# Patient Record
Sex: Female | Born: 1985 | Race: White | Hispanic: No | Marital: Married | Smoking: Former smoker
Health system: Southern US, Community
[De-identification: ages and names within clinical notes are randomized; demographics above are authoritative.]

## PROBLEM LIST (undated history)

## (undated) DIAGNOSIS — I2699 Other pulmonary embolism without acute cor pulmonale: Secondary | ICD-10-CM

## (undated) DIAGNOSIS — R519 Headache, unspecified: Secondary | ICD-10-CM

## (undated) DIAGNOSIS — R51 Headache: Secondary | ICD-10-CM

## (undated) HISTORY — DX: Other pulmonary embolism without acute cor pulmonale: I26.99

## (undated) HISTORY — PX: OTHER SURGICAL HISTORY: SHX169

## (undated) HISTORY — DX: Headache: R51

## (undated) HISTORY — DX: Headache, unspecified: R51.9

---

## 2010-04-01 ENCOUNTER — Emergency Department (HOSPITAL_COMMUNITY): Admission: EM | Admit: 2010-04-01 | Discharge: 2010-04-01 | Payer: Self-pay | Admitting: Family Medicine

## 2010-04-01 ENCOUNTER — Emergency Department (HOSPITAL_COMMUNITY): Admission: EM | Admit: 2010-04-01 | Discharge: 2010-04-01 | Payer: Self-pay | Admitting: Emergency Medicine

## 2011-02-09 LAB — COMPREHENSIVE METABOLIC PANEL
ALT: 29 U/L (ref 0–35)
Albumin: 3.9 g/dL (ref 3.5–5.2)
Alkaline Phosphatase: 116 U/L (ref 39–117)
CO2: 23 mEq/L (ref 19–32)
GFR calc non Af Amer: >60 mL/min (ref >60)
Total Bilirubin: 0.5 mg/dL (ref 0.3–1.2)
Total Protein: 7.3 g/dL (ref 6.0–8.3)

## 2011-02-09 LAB — D-DIMER, QUANTITATIVE: D-Dimer, Quant: 0.31 ug/mL-FEU (ref 0.00–0.48)

## 2011-02-09 LAB — CBC
HCT: 42.4 % (ref 36.0–46.0)
Hemoglobin: 14.8 g/dL (ref 12.0–15.0)
MCV: 91.4 fL (ref 78.0–100.0)
Platelets: 260 10*3/uL (ref 150–400)
RBC: 4.63 MIL/uL (ref 3.87–5.11)
RDW: 14.1 % (ref 11.5–15.5)

## 2011-02-09 LAB — LIPASE, BLOOD: Lipase: 37 U/L (ref 11–59)

## 2011-02-09 LAB — POCT URINALYSIS DIP (DEVICE)
Bilirubin Urine: NEGATIVE
Specific Gravity, Urine: <=1.005 (ref 1.005–1.030)
Urobilinogen, UA: 0.2 mg/dL (ref 0.0–1.0)

## 2011-02-09 LAB — POCT PREGNANCY, URINE: Preg Test, Ur: NEGATIVE

## 2015-06-27 ENCOUNTER — Emergency Department (HOSPITAL_COMMUNITY)
Admission: EM | Admit: 2015-06-27 | Discharge: 2015-06-27 | Disposition: A | Discharge status: Home / Self Care | Payer: Self-pay | Attending: Emergency Medicine | Admitting: Emergency Medicine

## 2015-06-27 ENCOUNTER — Encounter (HOSPITAL_COMMUNITY): Payer: Self-pay | Admitting: Emergency Medicine

## 2015-06-27 DIAGNOSIS — H538 Other visual disturbances: Secondary | ICD-10-CM | POA: Insufficient documentation

## 2015-06-27 DIAGNOSIS — R202 Paresthesia of skin: Secondary | ICD-10-CM | POA: Insufficient documentation

## 2015-06-27 DIAGNOSIS — H53149 Visual discomfort, unspecified: Secondary | ICD-10-CM | POA: Insufficient documentation

## 2015-06-27 DIAGNOSIS — Z79899 Other long term (current) drug therapy: Secondary | ICD-10-CM | POA: Insufficient documentation

## 2015-06-27 DIAGNOSIS — H9319 Tinnitus, unspecified ear: Secondary | ICD-10-CM | POA: Insufficient documentation

## 2015-06-27 DIAGNOSIS — R519 Headache, unspecified: Secondary | ICD-10-CM

## 2015-06-27 DIAGNOSIS — R51 Headache: Secondary | ICD-10-CM | POA: Insufficient documentation

## 2015-06-27 DIAGNOSIS — R112 Nausea with vomiting, unspecified: Secondary | ICD-10-CM | POA: Insufficient documentation

## 2015-06-27 DIAGNOSIS — F419 Anxiety disorder, unspecified: Secondary | ICD-10-CM | POA: Insufficient documentation

## 2015-06-27 DIAGNOSIS — Z3202 Encounter for pregnancy test, result negative: Secondary | ICD-10-CM | POA: Insufficient documentation

## 2015-06-27 DIAGNOSIS — R200 Anesthesia of skin: Secondary | ICD-10-CM | POA: Insufficient documentation

## 2015-06-27 DIAGNOSIS — M542 Cervicalgia: Secondary | ICD-10-CM | POA: Insufficient documentation

## 2015-06-27 LAB — CBC WITH DIFFERENTIAL/PLATELET
Basophils Absolute: 0.1 K/uL (ref 0.0–0.1)
Basophils Relative: 1 % (ref 0–1)
Eosinophils Absolute: 0.2 K/uL (ref 0.0–0.7)
Eosinophils Relative: 3 % (ref 0–5)
HCT: 40.7 % (ref 36.0–46.0)
Hemoglobin: 13.7 g/dL (ref 12.0–15.0)
Lymphocytes Relative: 28 % (ref 12–46)
Lymphs Abs: 2 K/uL (ref 0.7–4.0)
MCH: 31.4 pg (ref 26.0–34.0)
MCHC: 33.7 g/dL (ref 30.0–36.0)
MCV: 93.3 fL (ref 78.0–100.0)
Monocytes Absolute: 0.4 K/uL (ref 0.1–1.0)
Monocytes Relative: 5 % (ref 3–12)
Neutro Abs: 4.3 K/uL (ref 1.7–7.7)
Neutrophils Relative %: 63 % (ref 43–77)
Platelets: 301 K/uL (ref 150–400)
RBC: 4.36 MIL/uL (ref 3.87–5.11)
RDW: 13.2 % (ref 11.5–15.5)
WBC: 6.9 K/uL (ref 4.0–10.5)

## 2015-06-27 LAB — I-STAT BETA HCG BLOOD, ED (MC, WL, AP ONLY): I-stat hCG, quantitative: <5 m[IU]/mL

## 2015-06-27 LAB — COMPREHENSIVE METABOLIC PANEL
ALT: 30 U/L (ref 14–54)
ANION GAP: 8 (ref 5–15)
AST: 24 U/L (ref 15–41)
Albumin: 4 g/dL (ref 3.5–5.0)
Alkaline Phosphatase: 86 U/L (ref 38–126)
BILIRUBIN TOTAL: 0.5 mg/dL (ref 0.3–1.2)
BUN: 10 mg/dL (ref 6–20)
CALCIUM: 9.3 mg/dL (ref 8.9–10.3)
CHLORIDE: 108 mmol/L (ref 101–111)
CO2: 24 mmol/L (ref 22–32)
CREATININE: 0.72 mg/dL (ref 0.44–1.00)
GFR calc Af Amer: >60 mL/min (ref >60)
GFR calc non Af Amer: >60 mL/min (ref >60)
GLUCOSE: 105 mg/dL — AB (ref 65–99)
POTASSIUM: 3.4 mmol/L — AB (ref 3.5–5.1)
SODIUM: 140 mmol/L (ref 135–145)
TOTAL PROTEIN: 7.5 g/dL (ref 6.5–8.1)

## 2015-06-27 MED ORDER — DIPHENHYDRAMINE HCL 50 MG/ML IJ SOLN
25.0000 mg | Freq: Once | INTRAMUSCULAR | Status: AC
  Administered 2015-06-27: 25 mg via INTRAVENOUS
  Filled 2015-06-27: qty 1

## 2015-06-27 MED ORDER — TRAMADOL HCL 50 MG PO TABS
50.0000 mg | ORAL_TABLET | Freq: Once | ORAL | Status: AC
  Administered 2015-06-27: 50 mg via ORAL
  Filled 2015-06-27: qty 1

## 2015-06-27 MED ORDER — SODIUM CHLORIDE 0.9 % IV BOLUS (SEPSIS)
1000.0000 mL | Freq: Once | INTRAVENOUS | Status: AC
  Administered 2015-06-27: 1000 mL via INTRAVENOUS

## 2015-06-27 MED ORDER — METOCLOPRAMIDE HCL 5 MG/ML IJ SOLN
10.0000 mg | Freq: Once | INTRAMUSCULAR | Status: AC
  Administered 2015-06-27: 10 mg via INTRAVENOUS
  Filled 2015-06-27: qty 2

## 2015-06-27 NOTE — ED Provider Notes (Signed)
CSN: [id number]     Arrival date & time 06/27/15  1526 History   First MD Initiated Contact with Patient 06/27/15 1655     Chief Complaint  Patient presents with  . Headache    HPI  Danielle Townsend is a 29 year old female presenting with a 1 week history of headache. She describes a 4 month history of similar headache episodes with multiple ED visits to [facility]. The episodes start as numbness and tingling on the right side of her face that then develop into a generalized headache. She describes the pain as sharp and like a tight pressure. She endorses photophobia, phonophobia, seeing black spots in her visual field, feeling confused, tension in the neck, nausea and vomiting accompanying her headaches. She has tried ibuprofen and tramadol for the headaches with no relief. She denies any headache triggers. Denies fever, vision loss, slurred speech, facial droop, limb weakness, imbalance, or recent head trauma.   History reviewed. No pertinent past medical history. History reviewed. No pertinent past surgical history. History reviewed. No pertinent family history. History  Substance Use Topics  . Smoking status: Never Smoker   . Smokeless tobacco: Not on file  . Alcohol Use: No   OB History    No data available     Review of Systems  Constitutional: Negative for fever.  HENT: Positive for tinnitus. Negative for ear pain and hearing loss.   Eyes: Positive for photophobia and visual disturbance.  Respiratory: Negative for shortness of breath.   Cardiovascular: Negative for chest pain and palpitations.  Gastrointestinal: Positive for nausea and vomiting. Negative for abdominal pain, diarrhea and constipation.  Musculoskeletal: Positive for neck stiffness. Negative for myalgias and arthralgias.  Skin: Negative for rash.  Neurological: Positive for numbness and headaches. Negative for dizziness, syncope, speech difficulty and weakness.  Psychiatric/Behavioral: The patient is  nervous/anxious.       Allergies  Review of patient's allergies indicates no known allergies.  Home Medications   Prior to Admission medications   Medication Sig Start Date End Date Taking? Authorizing Provider  ALPRAZolam Prudy Feeler) 0.25 MG tablet Take 0.25 mg by mouth daily as needed for anxiety.  06/10/15  Yes Historical Provider, MD   BP 94/65 mmHg  Pulse 67  Temp(Src) 98.3 F (36.8 C) (Oral)  Resp 17  SpO2 100% Physical Exam  Constitutional: She is oriented to person, place, and time. She appears well-developed and well-nourished. She appears distressed (tearful).  HENT:  Head: Normocephalic and atraumatic.  Eyes: EOM are normal. Pupils are equal, round, and reactive to light.  Neck: Normal range of motion. Neck supple.  Cardiovascular: Normal rate, regular rhythm and normal heart sounds.   Pulmonary/Chest: Effort normal and breath sounds normal.  Abdominal: Soft. There is no tenderness.  Neurological: She is alert and oriented to person, place, and time. She has normal strength. No cranial nerve deficit. Coordination normal.  Skin: Skin is warm and dry. No rash noted.  Psychiatric:  Tearful during interview, admits to feeling anxious about not being able to control her headaches    ED Course  Procedures (including critical care time) Labs Review Labs Reviewed  COMPREHENSIVE METABOLIC PANEL - Abnormal; Notable for the following:    Potassium 3.4 (*)    Glucose, Bld 105 (*)    All other components within normal limits  CBC WITH DIFFERENTIAL/PLATELET  I-STAT BETA HCG BLOOD, ED ([facility], [facility], [facility] ONLY)    Imaging Review No results found.   EKG Interpretation None  MDM   Final diagnoses:  Intractable episodic headache, unspecified headache type    1. Intractable Headache - CBC, CMP, HCG with no significant findings - Multiple negative head CTs from [facility] as reported by patient - Reglan 10 mg, Benadryl 25 mg, Ultram 50 mgt, NS bolus 1 L given in  ED with significant relief - Given numbers for [facility] neurology and told to make an appointment next week   Danielle Heimlich, PA-C 06/27/15 2215  Glynn Octave, MD 06/28/15 [phone]

## 2015-06-27 NOTE — ED Notes (Signed)
Discussed plan of care with Josh,  PA-C. Bolus has completed, bp 94/60s. He acknowledges, patient is to ambulate.

## 2015-06-27 NOTE — Discharge Instructions (Signed)
-  Continue at home regimen for headache treatment - Call neurology to set up an appointment - Return to emergency department if you develop fever, vision loss, weakness, neck stiffness, or worsening symptoms

## 2015-06-27 NOTE — ED Notes (Signed)
Patient ambulates in hallway approximately 191ft with RN supervision. Denies feeling dizzy while walking. Returned to room and discussed plan of care.

## 2015-06-27 NOTE — ED Notes (Signed)
Pt c/o generalized HA with N/V x 5 days with photophobia

## 2015-07-04 ENCOUNTER — Ambulatory Visit (INDEPENDENT_AMBULATORY_CARE_PROVIDER_SITE_OTHER): Payer: Self-pay | Admitting: Neurology

## 2015-07-04 ENCOUNTER — Encounter: Payer: Self-pay | Admitting: Neurology

## 2015-07-04 VITALS — BP 146/88 | HR 88 | Resp 16 | Ht 63.5 in | Wt 179.2 lb

## 2015-07-04 DIAGNOSIS — G43019 Migraine without aura, intractable, without status migrainosus: Secondary | ICD-10-CM | POA: Insufficient documentation

## 2015-07-04 DIAGNOSIS — R51 Headache: Secondary | ICD-10-CM

## 2015-07-04 DIAGNOSIS — F411 Generalized anxiety disorder: Secondary | ICD-10-CM | POA: Insufficient documentation

## 2015-07-04 DIAGNOSIS — G8929 Other chronic pain: Secondary | ICD-10-CM

## 2015-07-04 DIAGNOSIS — R208 Other disturbances of skin sensation: Secondary | ICD-10-CM

## 2015-07-04 DIAGNOSIS — R200 Anesthesia of skin: Secondary | ICD-10-CM

## 2015-07-04 DIAGNOSIS — R519 Headache, unspecified: Secondary | ICD-10-CM

## 2015-07-04 MED ORDER — AMITRIPTYLINE HCL 10 MG PO TABS
10.0000 mg | ORAL_TABLET | Freq: Every day | ORAL | Status: DC
Start: 2015-07-04 — End: 2016-10-15

## 2015-07-04 NOTE — Patient Instructions (Signed)
1.  I recommend that you sign up for the [facility] because we need to order tests.  MRI of brain with and without contrast, MRA of head without contrast and MRA of neck with contrast 2.  You will need to establish care with a primary care provider 3.  Start amitriptyline  at bedtime.  Give it 4 weeks and then call us with update.  We can increase dose if needed.  4.  Stop tramadol 5.  Follow up in 3 months.

## 2015-07-04 NOTE — Progress Notes (Addendum)
NEUROLOGY CONSULTATION NOTE  Danielle Townsend MRN: [id number] DOB: Dec 03, 1985   Referring provider: Glynn Octave, MD (from ED) Primary care provider: none  Reason for consult:  headache  HISTORY OF PRESENT ILLNESS: Danielle Townsend is a 29 year old right-handed female with generalized anxiety disorder, migraines and history of pulmonary embolism while pregnant who presents for right sided cephalgia.  ED and hospital notes from [facility], ED note from [facility], labs and CT head report reviewed.  In April, she was hospitalized at [facility] for palpitations.  As per hospital notes, CXR, EKG and labs, including CBC, CMP and troponins were normal.  She was diagnosed with a panic attack.  Since then, she has had a right sided cephalgia.  She describes a constant numbness and tingling involving the right temporal and parietal region, radiating down to the right side of her jaw.  She also notes a throbbing on the right side of her head as well as tightness and pain in the right side of her jaw.  It hurts when she eats.  She also has some pain on the right side of her neck.  She also notes generalized weakness but no focal weakness or numbness of the extremities.  She denies any strenuous activity or neck injury prior to onset of symptoms.  In May, she went to the ED at [facility] for these symptoms.  CT of the head was performed and was unremarkable.  Urine drug screen positive for THC.  She went to see a dentist who performed X-rays and was told nothing was wrong.  Last week, she went to the [facility] ED for a migraine, described as a severe holocephalic and bilateral retro-orbital 10/10 pounding pain.  It was associated with nausea, vomiting, photophobia, phonophobia, blurred vision and dizziness.  It lasted about 6 hours and was treated in the ED.  She has a history of migraines from childhood up to after having her first child in 2008.  This was her first migraine since then.   She also has history of generalized anxiety disorder.  She has had significant increase in anxiety since having her second child in 2014.  During that pregnancy, she developed a PE and was on anticoagulation for a year.  She has been on several antidepressants, most recently Lexapro and Zoloft, which she had side effects.  She also takes Xanax.  She has family history of MS.  PAST MEDICAL HISTORY: Past Medical History  Diagnosis Date  . Headache   . Pulmonary emboli     PAST SURGICAL HISTORY: Past Surgical History  Procedure Laterality Date  . C sections      MEDICATIONS: Current Outpatient Prescriptions on File Prior to Visit  Medication Sig Dispense Refill  . ALPRAZolam (XANAX) 0.25 MG tablet Take 0.25 mg by mouth daily as needed for anxiety.   1   No current facility-administered medications on file prior to visit.    ALLERGIES: No Known Allergies  FAMILY HISTORY: Family History  Problem Relation Age of Onset  . Diabetes Brother   . Celiac disease Brother   . Multiple sclerosis Maternal Grandmother   . Cancer Maternal Grandfather     prostate CLL   . Diabetes Maternal Grandfather   . Cancer Paternal Grandmother     breast   . Diabetes Paternal Grandfather     SOCIAL HISTORY: Social History   Social History  . Marital Status: Married    Spouse Name: N/A  . Number of  Children: N/A  . Years of Education: N/A   Occupational History  . Not on file.   Social History Main Topics  . Smoking status: Former Games developer  . Smokeless tobacco: Never Used  . Alcohol Use: 0.0 oz/week    0 Standard drinks or equivalent per week     Comment: socially   . Drug Use: No  . Sexual Activity:    Partners: Male   Other Topics Concern  . Not on file   Social History Narrative    REVIEW OF SYSTEMS: Constitutional: No fevers, chills, or sweats, no generalized fatigue, change in appetite Eyes: No visual changes, double vision, eye pain Ear, nose and throat: No hearing loss,  ear pain, nasal congestion, sore throat Cardiovascular: No chest pain, palpitations Respiratory:  No shortness of breath at rest or with exertion, wheezes GastrointestinaI: No nausea, vomiting, diarrhea, abdominal pain, fecal incontinence Genitourinary:  No dysuria, urinary retention or frequency Musculoskeletal:  Head and neck pain Integumentary: No rash, pruritus, skin lesions Neurological: as above Psychiatric: depression, anxiety Endocrine: No palpitations, fatigue, diaphoresis, mood swings, change in appetite, change in weight, increased thirst Hematologic/Lymphatic:  No anemia, purpura, petechiae. Allergic/Immunologic: no itchy/runny eyes, nasal congestion, recent allergic reactions, rashes  PHYSICAL EXAM: Filed Vitals:   07/04/15 0845  BP: 146/88  Pulse: 88  Resp: 16   General: No acute distress.  Easily tearful.  Patient appears well-groomed.   Head:  Normocephalic/atraumatic.  Tenderness to palpation of right TMJ, without crepitus. Eyes:  fundi unremarkable, without vessel changes, exudates, hemorrhages or papilledema. Neck: supple, bilateral tenderness, full range of motion, right suboccipital tenderness. Back: No paraspinal tenderness Heart: regular rate and rhythm Lungs: Clear to auscultation bilaterally. Vascular: No carotid bruits. Neurological Exam: Mental status: alert and oriented to person, place, and time, recent and remote memory intact, fund of knowledge intact, attention and concentration intact, speech fluent and not dysarthric, language intact. Cranial nerves: CN I: not tested CN II: pupils equal, round and reactive to light, visual fields intact, fundi unremarkable, without vessel changes, exudates, hemorrhages or papilledema. CN III, IV, VI:  full range of motion, no nystagmus, no ptosis CN V: facial sensation intact CN VII: upper and lower face symmetric CN VIII: hearing intact CN IX, X: gag intact, uvula midline CN XI: sternocleidomastoid and  trapezius muscles intact CN XII: tongue midline Bulk & Tone: normal, no fasciculations. Motor:  5/5 throughout Sensation:  Temperature and vibration intact Deep Tendon Reflexes:  3+ and symmetric in patellars, otherwise 2+.  Toes downgoing. Finger to nose testing:  No dysmetria Heel to shin:  No dysmetria Gait:  Normal station and stride.  Able to turn and walk in tandem. Romberg negative.  IMPRESSION: Fairly new-onset right cephalic and facial numbness and tingling, associated with right sided headache, neck pain and jaw pain.  Unclear etiology.  Isolated migraine without aura.  Isolated episode. Generalized anxiety disorder  PLAN: 1.  I would like to get MRI of brain with and without contrast and MRA of head and neck to rule out intracranial structural abnormality, intracranial aneurysm or arterial dissection.  Unfortunately, she does not have insurance at this time.  We instructed her on how to apply for the [facility] which she will do immediately. 2.  I also instructed her to establish care with a primary care provider 3.  In the meantime, I will prescribe her amitriptyline 10mg  at bedtime to help with the head paresthesias/discomfort.  Side effects discussed. She will call in 4 weeks  with update and we can adjust dose if needed.  Advised to discontinue tramadol, which she has not been taking anyway. 4.  Follow up in 3 months.  Thank you for allowing me to take part in the care of this patient.  Shon Millet, DO

## 2015-09-13 ENCOUNTER — Emergency Department (HOSPITAL_COMMUNITY)
Admission: EM | Admit: 2015-09-13 | Discharge: 2015-09-13 | Disposition: A | Discharge status: Home / Self Care | Payer: Self-pay | Attending: Emergency Medicine | Admitting: Emergency Medicine

## 2015-09-13 ENCOUNTER — Encounter (HOSPITAL_COMMUNITY): Payer: Self-pay

## 2015-09-13 DIAGNOSIS — S39012A Strain of muscle, fascia and tendon of lower back, initial encounter: Secondary | ICD-10-CM | POA: Insufficient documentation

## 2015-09-13 DIAGNOSIS — X58XXXA Exposure to other specified factors, initial encounter: Secondary | ICD-10-CM | POA: Insufficient documentation

## 2015-09-13 DIAGNOSIS — Z3202 Encounter for pregnancy test, result negative: Secondary | ICD-10-CM | POA: Insufficient documentation

## 2015-09-13 DIAGNOSIS — Y9289 Other specified places as the place of occurrence of the external cause: Secondary | ICD-10-CM | POA: Insufficient documentation

## 2015-09-13 DIAGNOSIS — Z87891 Personal history of nicotine dependence: Secondary | ICD-10-CM | POA: Insufficient documentation

## 2015-09-13 DIAGNOSIS — Y9389 Activity, other specified: Secondary | ICD-10-CM | POA: Insufficient documentation

## 2015-09-13 DIAGNOSIS — Z86711 Personal history of pulmonary embolism: Secondary | ICD-10-CM | POA: Insufficient documentation

## 2015-09-13 DIAGNOSIS — Y998 Other external cause status: Secondary | ICD-10-CM | POA: Insufficient documentation

## 2015-09-13 LAB — CBC WITH DIFFERENTIAL/PLATELET
Basophils Absolute: 0.1 10*3/uL (ref 0.0–0.1)
Basophils Relative: 1 %
Eosinophils Absolute: 0.2 10*3/uL (ref 0.0–0.7)
Eosinophils Relative: 4 %
HCT: 40 % (ref 36.0–46.0)
Hemoglobin: 13.2 g/dL (ref 12.0–15.0)
Lymphocytes Relative: 37 %
Lymphs Abs: 1.8 10*3/uL (ref 0.7–4.0)
MCH: 30.8 pg (ref 26.0–34.0)
MCHC: 33 g/dL (ref 30.0–36.0)
MCV: 93.5 fL (ref 78.0–100.0)
Monocytes Absolute: 0.4 10*3/uL (ref 0.1–1.0)
Monocytes Relative: 8 %
Neutro Abs: 2.5 10*3/uL (ref 1.7–7.7)
Neutrophils Relative %: 50 %
Platelets: 304 10*3/uL (ref 150–400)
RBC: 4.28 MIL/uL (ref 3.87–5.11)
RDW: 13.2 % (ref 11.5–15.5)
WBC: 5 10*3/uL (ref 4.0–10.5)

## 2015-09-13 LAB — BASIC METABOLIC PANEL
Anion gap: 6 (ref 5–15)
BUN: 6 mg/dL (ref 6–20)
CHLORIDE: 112 mmol/L — AB (ref 101–111)
CO2: 24 mmol/L (ref 22–32)
CREATININE: 0.71 mg/dL (ref 0.44–1.00)
Calcium: 9 mg/dL (ref 8.9–10.3)
GLUCOSE: 87 mg/dL (ref 65–99)
Potassium: 3.6 mmol/L (ref 3.5–5.1)
Sodium: 142 mmol/L (ref 135–145)

## 2015-09-13 LAB — URINALYSIS, ROUTINE W REFLEX MICROSCOPIC
Bilirubin Urine: NEGATIVE
Glucose, UA: NEGATIVE mg/dL
Hgb urine dipstick: NEGATIVE
Ketones, ur: NEGATIVE mg/dL
Leukocytes, UA: NEGATIVE
Nitrite: NEGATIVE
Protein, ur: NEGATIVE mg/dL
Specific Gravity, Urine: 1.004 — ABNORMAL LOW (ref 1.005–1.030)
Urobilinogen, UA: 0.2 mg/dL (ref 0.0–1.0)
pH: 6 (ref 5.0–8.0)

## 2015-09-13 LAB — POC URINE PREG, ED: Preg Test, Ur: NEGATIVE

## 2015-09-13 MED ORDER — HYDROCODONE-ACETAMINOPHEN 5-325 MG PO TABS: 1.0000 | ORAL_TABLET | Freq: Four times a day (QID) | ORAL | Status: DC | PRN

## 2015-09-13 MED ORDER — HYDROCODONE-ACETAMINOPHEN 5-325 MG PO TABS
2.0000 | ORAL_TABLET | Freq: Once | ORAL | Status: AC
  Administered 2015-09-13: 2 via ORAL
  Filled 2015-09-13: qty 2

## 2015-09-13 MED ORDER — KETOROLAC TROMETHAMINE 60 MG/2ML IM SOLN
60.0000 mg | Freq: Once | INTRAMUSCULAR | Status: AC
  Administered 2015-09-13: 60 mg via INTRAMUSCULAR
  Filled 2015-09-13: qty 2

## 2015-09-13 MED ORDER — CYCLOBENZAPRINE HCL 10 MG PO TABS: 10.0000 mg | ORAL_TABLET | Freq: Two times a day (BID) | ORAL | Status: DC | PRN

## 2015-09-13 MED ORDER — CYCLOBENZAPRINE HCL 10 MG PO TABS
10.0000 mg | ORAL_TABLET | Freq: Once | ORAL | Status: AC
  Administered 2015-09-13: 10 mg via ORAL
  Filled 2015-09-13: qty 1

## 2015-09-13 MED ORDER — LIDOCAINE 5 % EX PTCH: 1.0000 | MEDICATED_PATCH | CUTANEOUS | Status: DC

## 2015-09-13 NOTE — Discharge Instructions (Signed)
Back Injury Prevention Back injuries can be very painful. They can also be difficult to heal. After having one back injury, you are more likely to injure your back again. It is important to learn how to avoid injuring or re-injuring your back. The following tips can help you to prevent a back injury. WHAT SHOULD I KNOW ABOUT PHYSICAL FITNESS?  Exercise for 30 minutes per day on most days of the week or as told by your doctor. Make sure to:  Do aerobic exercises, such as walking, jogging, biking, or swimming.  Do exercises that increase balance and strength, such as tai chi and yoga.  Do stretching exercises. This helps with flexibility.  Try to develop strong belly (abdominal) muscles. Your belly muscles help to support your back.  Stay at a healthy weight. This helps to decrease your risk of a back injury. WHAT SHOULD I KNOW ABOUT MY DIET?  Talk with your doctor about your overall diet. Take supplements and vitamins only as told by your doctor.  Talk with your doctor about how much calcium and vitamin D you need each day. These nutrients help to prevent weakening of the bones (osteoporosis).  Include good sources of calcium in your diet, such as:  Dairy products.  Green leafy vegetables.  Products that have had calcium added to them (fortified).  Include good sources of vitamin D in your diet, such as:  Milk.  Foods that have had vitamin D added to them. WHAT SHOULD I KNOW ABOUT MY POSTURE?  Sit up straight and stand up straight. Avoid leaning forward when you sit or hunching over when you stand.  Choose chairs that have good low-back (lumbar) support.  If you work at a desk, sit close to it so you do not need to lean over. Keep your chin tucked in. Keep your neck drawn back. Keep your elbows bent so your arms look like the letter "L" (right angle).  Sit high and close to the steering wheel when you drive. Add a low-back support to your car seat, if needed.  Avoid sitting  or standing in one position for very long. Take breaks to get up, stretch, and walk around at least one time every hour. Take breaks every hour if you are driving for long periods of time.  Sleep on your side with your knees slightly bent, or sleep on your back with a pillow under your knees. Do not lie on the front of your body to sleep. WHAT SHOULD I KNOW ABOUT LIFTING, TWISTING, AND REACHING Lifting and Heavy Lifting  Avoid heavy lifting, especially lifting over and over again. If you must do heavy lifting:  Stretch before lifting.  Work slowly.  Rest between lifts.  Use a tool such as a cart or a dolly to move objects if one is available.  Make several small trips instead of carrying one heavy load.  Ask for help when you need it, especially when moving big objects.  Follow these steps when lifting:  Stand with your feet shoulder-width apart.  Get as close to the object as you can. Do not pick up a heavy object that is far from your body.  Use handles or lifting straps if they are available.  Bend at your knees. Squat down, but keep your heels off the floor.  Keep your shoulders back. Keep your chin tucked in. Keep your back straight.  Lift the object slowly while you tighten the muscles in your legs, belly, and butt. Keep the object  as close to the center of your body as possible.  Follow these steps when putting down a heavy load:  Stand with your feet shoulder-width apart.  Lower the object slowly while you tighten the muscles in your legs, belly, and butt. Keep the object as close to the center of your body as possible.  Keep your shoulders back. Keep your chin tucked in. Keep your back straight.  Bend at your knees. Squat down, but keep your heels off the floor.  Use handles or lifting straps if they are available. Twisting and Reaching  Avoid lifting heavy objects above your waist.  Do not twist at your waist while you are lifting or carrying a load. If  you need to turn, move your feet.  Do not bend over without bending at your knees.  Avoid reaching over your head, across a table, or for an object on a high surface.  WHAT ARE SOME OTHER TIPS?  Avoid wet floors and icy ground. Keep sidewalks clear of ice to prevent falls.   Do not sleep on a mattress that is too soft or too hard.   Keep items that you use often within easy reach.   Put heavier objects on shelves at waist level, and put lighter objects on lower or higher shelves.  Find ways to lower your stress, such as:  Exercise.  Massage.  Relaxation techniques.  Talk with your doctor if you feel anxious or depressed. These conditions can make back pain worse.  Wear flat heel shoes with cushioned soles.  Avoid making quick (sudden) movements.  Use both shoulder straps when carrying a backpack.  Do not use any tobacco products, including cigarettes, chewing tobacco, or electronic cigarettes. If you need help quitting, ask your doctor.   This information is not intended to replace advice given to you by your health care provider. Make sure you discuss any questions you have with your health care provider.   Document Released: 04/26/2008 Document Revised: 03/25/2015 Document Reviewed: 11/12/2014 [employer] Interactive Patient Education 2016 [place] Strain With Rehab A strain is an injury in which a tendon or muscle is torn. The muscles and tendons of the lower back are vulnerable to strains. However, these muscles and tendons are very strong and require a great force to be injured. Strains are classified into three categories. Grade 1 strains cause pain, but the tendon is not lengthened. Grade 2 strains include a lengthened ligament, due to the ligament being stretched or partially ruptured. With grade 2 strains there is still function, although the function may be decreased. Grade 3 strains involve a complete tear of the tendon or muscle, and function is  usually impaired. SYMPTOMS   Pain in the lower back.  Pain that affects one side more than the other.  Pain that gets worse with movement and may be felt in the hip, buttocks, or back of the thigh.  Muscle spasms of the muscles in the back.  Swelling along the muscles of the back.  Loss of strength of the back muscles.  Crackling sound (crepitation) when the muscles are touched. CAUSES  Lower back strains occur when a force is placed on the muscles or tendons that is greater than they can handle. Common causes of injury include:  Prolonged overuse of the muscle-tendon units in the lower back, usually from incorrect posture.  A single violent injury or force applied to the back. RISK INCREASES WITH:  Sports that involve twisting forces on the spine  or a lot of bending at the waist (football, rugby, weightlifting, bowling, golf, tennis, speed skating, racquetball, swimming, running, gymnastics, diving).  Poor strength and flexibility.  Failure to warm up properly before activity.  Family history of lower back pain or disk disorders.  Previous back injury or surgery (especially fusion).  Poor posture with lifting, especially heavy objects.  Prolonged sitting, especially with poor posture. PREVENTION   Learn and use proper posture when sitting or lifting (maintain proper posture when sitting, lift using the knees and legs, not at the waist).  Warm up and stretch properly before activity.  Allow for adequate recovery between workouts.  Maintain physical fitness:  Strength, flexibility, and endurance.  Cardiovascular fitness. PROGNOSIS  If treated properly, lower back strains usually heal within 6 weeks. RELATED COMPLICATIONS   Recurring symptoms, resulting in a chronic problem.  Chronic inflammation, scarring, and partial muscle-tendon tear.  Delayed healing or resolution of symptoms.  Prolonged disability. TREATMENT  Treatment first involves the use of ice  and medicine, to reduce pain and inflammation. The use of strengthening and stretching exercises may help reduce pain with activity. These exercises may be performed at home or with a therapist. Severe injuries may require referral to a therapist for further evaluation and treatment, such as ultrasound. Your caregiver may advise that you wear a back brace or corset, to help reduce pain and discomfort. Often, prolonged bed rest results in greater harm then benefit. Corticosteroid injections may be recommended. However, these should be reserved for the most serious cases. It is important to avoid using your back when lifting objects. At night, sleep on your back on a firm mattress with a pillow placed under your knees. If non-surgical treatment is unsuccessful, surgery may be needed.  MEDICATION   If pain medicine is needed, nonsteroidal anti-inflammatory medicines (aspirin and ibuprofen), or other minor pain relievers (acetaminophen), are often advised.  Do not take pain medicine for 7 days before surgery.  Prescription pain relievers may be given, if your caregiver thinks they are needed. Use only as directed and only as much as you need.  Ointments applied to the skin may be helpful.  Corticosteroid injections may be given by your caregiver. These injections should be reserved for the most serious cases, because they may only be given a certain number of times. HEAT AND COLD  Cold treatment (icing) should be applied for 10 to 15 minutes every 2 to 3 hours for inflammation and pain, and immediately after activity that aggravates your symptoms. Use ice packs or an ice massage.  Heat treatment may be used before performing stretching and strengthening activities prescribed by your caregiver, physical therapist, or athletic trainer. Use a heat pack or a warm water soak. SEEK MEDICAL CARE IF:   Symptoms get worse or do not improve in 2 to 4 weeks, despite treatment.  You develop numbness, weakness,  or loss of bowel or bladder function.  New, unexplained symptoms develop. (Drugs used in treatment may produce side effects.) EXERCISES  RANGE OF MOTION (ROM) AND STRETCHING EXERCISES - Low Back Strain Most people with lower back pain will find that their symptoms get worse with excessive bending forward (flexion) or arching at the lower back (extension). The exercises which will help resolve your symptoms will focus on the opposite motion.  Your physician, physical therapist or athletic trainer will help you determine which exercises will be most helpful to resolve your lower back pain. Do not complete any exercises without first consulting with your  caregiver. Discontinue any exercises which make your symptoms worse until you speak to your caregiver.  If you have pain, numbness or tingling which travels down into your buttocks, leg or foot, the goal of the therapy is for these symptoms to move closer to your back and eventually resolve. Sometimes, these leg symptoms will get better, but your lower back pain may worsen. This is typically an indication of progress in your rehabilitation. Be very alert to any changes in your symptoms and the activities in which you participated in the 24 hours prior to the change. Sharing this information with your caregiver will allow him/her to most efficiently treat your condition.  These exercises may help you when beginning to rehabilitate your injury. Your symptoms may resolve with or without further involvement from your physician, physical therapist or athletic trainer. While completing these exercises, remember:  Restoring tissue flexibility helps normal motion to return to the joints. This allows healthier, less painful movement and activity.  An effective stretch should be held for at least 30 seconds.  A stretch should never be painful. You should only feel a gentle lengthening or release in the stretched tissue. FLEXION RANGE OF MOTION AND STRETCHING  EXERCISES: STRETCH - Flexion, Single Knee to Chest   Lie on a firm bed or floor with both legs extended in front of you.  Keeping one leg in contact with the floor, bring your opposite knee to your chest. Hold your leg in place by either grabbing behind your thigh or at your knee.  Pull until you feel a gentle stretch in your lower back. Hold __________ seconds.  Slowly release your grasp and repeat the exercise with the opposite side. Repeat __________ times. Complete this exercise __________ times per day.  STRETCH - Flexion, Double Knee to Chest   Lie on a firm bed or floor with both legs extended in front of you.  Keeping one leg in contact with the floor, bring your opposite knee to your chest.  Tense your stomach muscles to support your back and then lift your other knee to your chest. Hold your legs in place by either grabbing behind your thighs or at your knees.  Pull both knees toward your chest until you feel a gentle stretch in your lower back. Hold __________ seconds.  Tense your stomach muscles and slowly return one leg at a time to the floor. Repeat __________ times. Complete this exercise __________ times per day.  STRETCH - Low Trunk Rotation  Lie on a firm bed or floor. Keeping your legs in front of you, bend your knees so they are both pointed toward the ceiling and your feet are flat on the floor.  Extend your arms out to the side. This will stabilize your upper body by keeping your shoulders in contact with the floor.  Gently and slowly drop both knees together to one side until you feel a gentle stretch in your lower back. Hold for __________ seconds.  Tense your stomach muscles to support your lower back as you bring your knees back to the starting position. Repeat the exercise to the other side. Repeat __________ times. Complete this exercise __________ times per day  EXTENSION RANGE OF MOTION AND FLEXIBILITY EXERCISES: STRETCH - Extension, Prone on Elbows    Lie on your stomach on the floor, a bed will be too soft. Place your palms about shoulder width apart and at the height of your head.  Place your elbows under your shoulders. If this is too  painful, stack pillows under your chest.  Allow your body to relax so that your hips drop lower and make contact more completely with the floor.  Hold this position for __________ seconds.  Slowly return to lying flat on the floor. Repeat __________ times. Complete this exercise __________ times per day.  RANGE OF MOTION - Extension, Prone Press Ups  Lie on your stomach on the floor, a bed will be too soft. Place your palms about shoulder width apart and at the height of your head.  Keeping your back as relaxed as possible, slowly straighten your elbows while keeping your hips on the floor. You may adjust the placement of your hands to maximize your comfort. As you gain motion, your hands will come more underneath your shoulders.  Hold this position __________ seconds.  Slowly return to lying flat on the floor. Repeat __________ times. Complete this exercise __________ times per day.  RANGE OF MOTION- Quadruped, Neutral Spine   Assume a hands and knees position on a firm surface. Keep your hands under your shoulders and your knees under your hips. You may place padding under your knees for comfort.  Drop your head and point your tail bone toward the ground below you. This will round out your lower back like an angry cat. Hold this position for __________ seconds.  Slowly lift your head and release your tail bone so that your back sags into a large arch, like an old horse.  Hold this position for __________ seconds.  Repeat this until you feel limber in your lower back.  Now, find your "sweet spot." This will be the most comfortable position somewhere between the two previous positions. This is your neutral spine. Once you have found this position, tense your stomach muscles to support your  lower back.  Hold this position for __________ seconds. Repeat __________ times. Complete this exercise __________ times per day.  STRENGTHENING EXERCISES - Low Back Strain These exercises may help you when beginning to rehabilitate your injury. These exercises should be done near your "sweet spot." This is the neutral, low-back arch, somewhere between fully rounded and fully arched, that is your least painful position. When performed in this safe range of motion, these exercises can be used for people who have either a flexion or extension based injury. These exercises may resolve your symptoms with or without further involvement from your physician, physical therapist or athletic trainer. While completing these exercises, remember:   Muscles can gain both the endurance and the strength needed for everyday activities through controlled exercises.  Complete these exercises as instructed by your physician, physical therapist or athletic trainer. Increase the resistance and repetitions only as guided.  You may experience muscle soreness or fatigue, but the pain or discomfort you are trying to eliminate should never worsen during these exercises. If this pain does worsen, stop and make certain you are following the directions exactly. If the pain is still present after adjustments, discontinue the exercise until you can discuss the trouble with your caregiver. STRENGTHENING - Deep Abdominals, Pelvic Tilt  Lie on a firm bed or floor. Keeping your legs in front of you, bend your knees so they are both pointed toward the ceiling and your feet are flat on the floor.  Tense your lower abdominal muscles to press your lower back into the floor. This motion will rotate your pelvis so that your tail bone is scooping upwards rather than pointing at your feet or into the floor.  With a  gentle tension and even breathing, hold this position for __________ seconds. Repeat __________ times. Complete this exercise  __________ times per day.  STRENGTHENING - Abdominals, Crunches   Lie on a firm bed or floor. Keeping your legs in front of you, bend your knees so they are both pointed toward the ceiling and your feet are flat on the floor. Cross your arms over your chest.  Slightly tip your chin down without bending your neck.  Tense your abdominals and slowly lift your trunk high enough to just clear your shoulder blades. Lifting higher can put excessive stress on the lower back and does not further strengthen your abdominal muscles.  Control your return to the starting position. Repeat __________ times. Complete this exercise __________ times per day.  STRENGTHENING - Quadruped, Opposite UE/LE Lift   Assume a hands and knees position on a firm surface. Keep your hands under your shoulders and your knees under your hips. You may place padding under your knees for comfort.  Find your neutral spine and gently tense your abdominal muscles so that you can maintain this position. Your shoulders and hips should form a rectangle that is parallel with the floor and is not twisted.  Keeping your trunk steady, lift your right hand no higher than your shoulder and then your left leg no higher than your hip. Make sure you are not holding your breath. Hold this position __________ seconds.  Continuing to keep your abdominal muscles tense and your back steady, slowly return to your starting position. Repeat with the opposite arm and leg. Repeat __________ times. Complete this exercise __________ times per day.  STRENGTHENING - Lower Abdominals, Double Knee Lift  Lie on a firm bed or floor. Keeping your legs in front of you, bend your knees so they are both pointed toward the ceiling and your feet are flat on the floor.  Tense your abdominal muscles to brace your lower back and slowly lift both of your knees until they come over your hips. Be certain not to hold your breath.  Hold __________ seconds. Using your  abdominal muscles, return to the starting position in a slow and controlled manner. Repeat __________ times. Complete this exercise __________ times per day.  POSTURE AND BODY MECHANICS CONSIDERATIONS - Low Back Strain Keeping correct posture when sitting, standing or completing your activities will reduce the stress put on different body tissues, allowing injured tissues a chance to heal and limiting painful experiences. The following are general guidelines for improved posture. Your physician or physical therapist will provide you with any instructions specific to your needs. While reading these guidelines, remember:  The exercises prescribed by your provider will help you have the flexibility and strength to maintain correct postures.  The correct posture provides the best environment for your joints to work. All of your joints have less wear and tear when properly supported by a spine with good posture. This means you will experience a healthier, less painful body.  Correct posture must be practiced with all of your activities, especially prolonged sitting and standing. Correct posture is as important when doing repetitive low-stress activities (typing) as it is when doing a single heavy-load activity (lifting). RESTING POSITIONS Consider which positions are most painful for you when choosing a resting position. If you have pain with flexion-based activities (sitting, bending, stooping, squatting), choose a position that allows you to rest in a less flexed posture. You would want to avoid curling into a fetal position on your side. If your  pain worsens with extension-based activities (prolonged standing, working overhead), avoid resting in an extended position such as sleeping on your stomach. Most people will find more comfort when they rest with their spine in a more neutral position, neither too rounded nor too arched. Lying on a non-sagging bed on your side with a pillow between your knees, or on  your back with a pillow under your knees will often provide some relief. Keep in mind, being in any one position for a prolonged period of time, no matter how correct your posture, can still lead to stiffness. PROPER SITTING POSTURE In order to minimize stress and discomfort on your spine, you must sit with correct posture. Sitting with good posture should be effortless for a healthy body. Returning to good posture is a gradual process. Many people can work toward this most comfortably by using various supports until they have the flexibility and strength to maintain this posture on their own. When sitting with proper posture, your ears will fall over your shoulders and your shoulders will fall over your hips. You should use the back of the chair to support your upper back. Your lower back will be in a neutral position, just slightly arched. You may place a small pillow or folded towel at the base of your lower back for support.  When working at a desk, create an environment that supports good, upright posture. Without extra support, muscles tire, which leads to excessive strain on joints and other tissues. Keep these recommendations in mind: CHAIR:  A chair should be able to slide under your desk when your back makes contact with the back of the chair. This allows you to work closely.  The chair's height should allow your eyes to be level with the upper part of your monitor and your hands to be slightly lower than your elbows. BODY POSITION  Your feet should make contact with the floor. If this is not possible, use a foot rest.  Keep your ears over your shoulders. This will reduce stress on your neck and lower back. INCORRECT SITTING POSTURES  If you are feeling tired and unable to assume a healthy sitting posture, do not slouch or slump. This puts excessive strain on your back tissues, causing more damage and pain. Healthier options include:  Using more support, like a lumbar pillow.  Switching  tasks to something that requires you to be upright or walking.  Talking a brief walk.  Lying down to rest in a neutral-spine position. PROLONGED STANDING WHILE SLIGHTLY LEANING FORWARD  When completing a task that requires you to lean forward while standing in one place for a long time, place either foot up on a stationary 2-4 inch high object to help maintain the best posture. When both feet are on the ground, the lower back tends to lose its slight inward curve. If this curve flattens (or becomes too large), then the back and your other joints will experience too much stress, tire more quickly, and can cause pain. CORRECT STANDING POSTURES Proper standing posture should be assumed with all daily activities, even if they only take a few moments, like when brushing your teeth. As in sitting, your ears should fall over your shoulders and your shoulders should fall over your hips. You should keep a slight tension in your abdominal muscles to brace your spine. Your tailbone should point down to the ground, not behind your body, resulting in an over-extended swayback posture.  INCORRECT STANDING POSTURES  Common incorrect standing postures  include a forward head, locked knees and/or an excessive swayback. WALKING Walk with an upright posture. Your ears, shoulders and hips should all line-up. PROLONGED ACTIVITY IN A FLEXED POSITION When completing a task that requires you to bend forward at your waist or lean over a low surface, try to find a way to stabilize 3 out of 4 of your limbs. You can place a hand or elbow on your thigh or rest a knee on the surface you are reaching across. This will provide you more stability so that your muscles do not fatigue as quickly. By keeping your knees relaxed, or slightly bent, you will also reduce stress across your lower back. CORRECT LIFTING TECHNIQUES DO :   Assume a wide stance. This will provide you more stability and the opportunity to get as close as possible  to the object which you are lifting.  Tense your abdominals to brace your spine. Bend at the knees and hips. Keeping your back locked in a neutral-spine position, lift using your leg muscles. Lift with your legs, keeping your back straight.  Test the weight of unknown objects before attempting to lift them.  Try to keep your elbows locked down at your sides in order get the best strength from your shoulders when carrying an object.  Always ask for help when lifting heavy or awkward objects. INCORRECT LIFTING TECHNIQUES DO NOT:   Lock your knees when lifting, even if it is a small object.  Bend and twist. Pivot at your feet or move your feet when needing to change directions.  Assume that you can safely pick up even a paper clip without proper posture.   This information is not intended to replace advice given to you by your health care provider. Make sure you discuss any questions you have with your health care provider.   Document Released: 11/08/2005 Document Revised: 11/29/2014 Document Reviewed: 02/20/2009 Elsevier Interactive Patient Education [employer].

## 2015-09-13 NOTE — ED Provider Notes (Addendum)
CSN: [id number]     Arrival date & time 09/13/15  1032 History   First MD Initiated Contact with Patient 09/13/15 1050     Chief Complaint  Patient presents with  . Back Pain     (Consider location/radiation/quality/duration/timing/severity/associated sxs/prior Treatment) Patient is a 29 y.o. female presenting with back pain. The history is provided by the patient.  Back Pain Location:  Lumbar spine Quality:  Aching, shooting and stabbing Radiates to: left buttocks. 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 Headache   . Pulmonary emboli [facility]HCC)    Past Surgical History  Procedure Laterality Date  . C sections     Family History  Problem Relation Age of Onset  . Diabetes Brother   . Celiac disease Brother   . Multiple sclerosis Maternal Grandmother   . Cancer Maternal Grandfather     prostate CLL   . Diabetes Maternal Grandfather   . Cancer Paternal Grandmother     breast   . Diabetes Paternal Grandfather    Social History  Substance Use Topics  . Smoking status: Former Games developermoker  . Smokeless tobacco: Never Used  . Alcohol Use: 0.0 oz/week    0 Standard drinks or equivalent per week     Comment: socially    OB History    No data  available     Review of Systems  Constitutional: Negative for fever.  Cardiovascular: Negative for chest pain.  Gastrointestinal: Negative for abdominal pain.  Genitourinary: Negative for dysuria.  Musculoskeletal: Positive for back pain.  Neurological: Negative for weakness, numbness and paresthesias.  All other systems reviewed and are negative.     Allergies  Review of patient's allergies indicates no known allergies.  Home Medications   Prior to Admission medications   Medication Sig Start Date End Date Taking? Authorizing Provider  ALPRAZolam Prudy Feeler(XANAX) 0.25 MG tablet Take 0.25 mg by mouth daily as needed for anxiety.  06/10/15  Yes Historical Provider, MD  ibuprofen (ADVIL,MOTRIN) 800 MG tablet Take 800 mg by mouth every 8 (eight) hours as needed (pain).  05/20/15  Yes Historical Provider, MD  Naproxen Sodium (ALEVE PO) Take 1 tablet by mouth 2 (two) times daily as needed (pain).   Yes Historical Provider, MD  amitriptyline (ELAVIL) 10 MG tablet Take 1 tablet (10 mg total) by mouth at bedtime. Patient not taking: Reported on 09/13/2015 07/04/15   Drema DallasAdam R Jaffe, DO   BP 120/73 mmHg  Pulse 69  Resp 20  Ht 5\' 4"  (1.626 m)  Wt 170 lb (77.111 kg)  BMI 29.17 kg/m2  SpO2 99%  LMP 09/08/2015 Physical Exam  Constitutional: She is oriented to person, place, and time. She appears well-developed and well-nourished. No distress.  HENT:  Head: Normocephalic and  atraumatic.  Mouth/Throat: Oropharynx is clear and moist.  Eyes: Conjunctivae and EOM are normal. Pupils are equal, round, and reactive to light.  Neck: Normal range of motion. Neck supple.  Cardiovascular: Normal rate, regular rhythm and intact distal pulses.   No murmur heard. Pulmonary/Chest: Effort normal and breath sounds normal. No respiratory distress. She has no wheezes. She has no rales.  Abdominal: Soft. She exhibits no distension. There is no tenderness. There is no rebound, no guarding and no CVA tenderness.    Musculoskeletal: Normal range of motion. She exhibits no edema.       Lumbar back: She exhibits tenderness, bony tenderness, pain and spasm.       Back:  Neurological: She is alert and oriented to person, place, and time. She has normal strength. No sensory deficit.  Skin: Skin is warm and dry. No rash noted. No erythema.  Psychiatric: She has a normal mood and affect. Her behavior is normal.  Nursing note and vitals reviewed.   ED Course  Procedures (including critical care time) Labs Review Labs Reviewed  BASIC METABOLIC PANEL - Abnormal; Notable for the following:    Chloride 112 (*)    All other components within normal limits  URINALYSIS, ROUTINE W REFLEX MICROSCOPIC (NOT AT [facility]) - Abnormal; Notable for the following:    Specific Gravity, Urine 1.004 (*)    All other components within normal limits  CBC WITH DIFFERENTIAL/PLATELET  POC URINE PREG, ED    Imaging Review No results found. I have personally reviewed and evaluated these images and lab results as part of my medical decision-making.   EKG Interpretation None      MDM   Final diagnoses:  Back strain, initial encounter    Patient is an otherwise healthy 29 year old who presents today with severe 10 out of 10 lower back pain that's been worsening over the last 4 days. She has mild radiation into the left buttocks. Pain radiates around to the abdomen but no true abdominal pain. No numbness or tingling in the legs. She denies any recent surgeries, injections or IV drug use. No history of cancer. No unilateral leg pain or swelling at this time. She states she will occasionally get swelling of her left leg and did have a PE when pregnant but no new symptoms in those areas. She denies any vaginal discharge, itching and finished her menses a few days ago.  She states she had pain like this one other time and was told she had a UTI. 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  On exam patient is tearful and has significant tenderness in the lumbar spine as well as the paraspinal muscles. She is able to ambulate but it's painful as well as normal strength and sensation of the legs.  Possibility for a urinary process versus musculoskeletal. She has no risk factors for epidural abscess or hematoma. She denies any trauma. Low suspicion for vaginal pathology. Low suspicion for ovarian torsion. Low suspicion for kidney stone.  1:33 PM Labs are without acute findings. Some improvement with pain control. Feel most likely symptoms are musculoskeletal in nature. Discussed with patient NSAIDs, Flexeril and pain control.  Gwyneth Sprout, MD 09/13/15 1334  Gwyneth Sprout, MD 09/13/15 1342

## 2015-09-13 NOTE — ED Notes (Signed)
Lower back pain began 4 days ago. Denies any injuries.  She had this type of pain before and she was having a UTI,   Pt. Denies any dysuria or urgency, Pt. Denies any vaginal problems.  Pt. Very uncomfortable

## 2015-10-06 ENCOUNTER — Ambulatory Visit: Payer: Self-pay | Admitting: Neurology

## 2015-12-02 ENCOUNTER — Emergency Department (HOSPITAL_COMMUNITY): Payer: Self-pay

## 2015-12-02 ENCOUNTER — Encounter (HOSPITAL_COMMUNITY): Payer: Self-pay | Admitting: Emergency Medicine

## 2015-12-02 DIAGNOSIS — Z3202 Encounter for pregnancy test, result negative: Secondary | ICD-10-CM | POA: Insufficient documentation

## 2015-12-02 DIAGNOSIS — Z87891 Personal history of nicotine dependence: Secondary | ICD-10-CM | POA: Insufficient documentation

## 2015-12-02 DIAGNOSIS — R0789 Other chest pain: Secondary | ICD-10-CM | POA: Insufficient documentation

## 2015-12-02 DIAGNOSIS — R110 Nausea: Secondary | ICD-10-CM | POA: Insufficient documentation

## 2015-12-02 LAB — CBC
HCT: 42.9 % (ref 36.0–46.0)
Hemoglobin: 13.8 g/dL (ref 12.0–15.0)
MCH: 30.1 pg (ref 26.0–34.0)
MCHC: 32.2 g/dL (ref 30.0–36.0)
MCV: 93.7 fL (ref 78.0–100.0)
PLATELETS: 315 10*3/uL (ref 150–400)
RBC: 4.58 MIL/uL (ref 3.87–5.11)
RDW: 13.8 % (ref 11.5–15.5)
WBC: 9.5 10*3/uL (ref 4.0–10.5)

## 2015-12-02 LAB — BASIC METABOLIC PANEL
Anion gap: 10 (ref 5–15)
BUN: 12 mg/dL (ref 6–20)
CALCIUM: 9.2 mg/dL (ref 8.9–10.3)
CHLORIDE: 112 mmol/L — AB (ref 101–111)
CO2: 22 mmol/L (ref 22–32)
CREATININE: 0.69 mg/dL (ref 0.44–1.00)
GFR calc Af Amer: >60 mL/min (ref >60)
GFR calc non Af Amer: >60 mL/min (ref >60)
GLUCOSE: 96 mg/dL (ref 65–99)
Potassium: 3.9 mmol/L (ref 3.5–5.1)
Sodium: 144 mmol/L (ref 135–145)

## 2015-12-02 LAB — I-STAT TROPONIN, ED: TROPONIN I, POC: 0 ng/mL (ref 0.00–0.08)

## 2015-12-02 NOTE — ED Notes (Signed)
Pt states for the last 2 weeks she has had a sharp pain in her left side of of chest under her breast with shortness of breath. Pt states hx. Of PE with pregnancy 3 years ago.

## 2015-12-03 ENCOUNTER — Emergency Department (HOSPITAL_COMMUNITY): Payer: Self-pay

## 2015-12-03 ENCOUNTER — Emergency Department (HOSPITAL_COMMUNITY)
Admission: EM | Admit: 2015-12-03 | Discharge: 2015-12-03 | Disposition: A | Discharge status: Home / Self Care | Payer: Self-pay | Attending: Emergency Medicine | Admitting: Emergency Medicine

## 2015-12-03 ENCOUNTER — Encounter (HOSPITAL_COMMUNITY): Payer: Self-pay | Admitting: Radiology

## 2015-12-03 DIAGNOSIS — R0789 Other chest pain: Secondary | ICD-10-CM

## 2015-12-03 LAB — URINALYSIS, ROUTINE W REFLEX MICROSCOPIC
Bilirubin Urine: NEGATIVE
GLUCOSE, UA: NEGATIVE mg/dL
Hgb urine dipstick: NEGATIVE
KETONES UR: NEGATIVE mg/dL
LEUKOCYTES UA: NEGATIVE
NITRITE: NEGATIVE
PH: 5.5 (ref 5.0–8.0)
Protein, ur: NEGATIVE mg/dL
SPECIFIC GRAVITY, URINE: 1.024 (ref 1.005–1.030)

## 2015-12-03 LAB — POC URINE PREG, ED: Preg Test, Ur: NEGATIVE

## 2015-12-03 MED ORDER — KETOROLAC TROMETHAMINE 30 MG/ML IJ SOLN
30.0000 mg | Freq: Once | INTRAMUSCULAR | Status: AC
  Administered 2015-12-03: 30 mg via INTRAVENOUS
  Filled 2015-12-03: qty 1

## 2015-12-03 MED ORDER — LORAZEPAM 1 MG PO TABS
1.0000 mg | ORAL_TABLET | Freq: Once | ORAL | Status: AC
  Administered 2015-12-03: 1 mg via ORAL
  Filled 2015-12-03: qty 1

## 2015-12-03 MED ORDER — IOHEXOL 350 MG/ML SOLN
100.0000 mL | Freq: Once | INTRAVENOUS | Status: AC | PRN
  Administered 2015-12-03: 100 mL via INTRAVENOUS

## 2015-12-03 MED ORDER — NAPROXEN 500 MG PO TABS: 500.0000 mg | ORAL_TABLET | Freq: Two times a day (BID) | ORAL | Status: DC

## 2015-12-03 MED ORDER — MORPHINE SULFATE (PF) 4 MG/ML IV SOLN
4.0000 mg | Freq: Once | INTRAVENOUS | Status: AC
  Administered 2015-12-03: 4 mg via INTRAVENOUS
  Filled 2015-12-03: qty 1

## 2015-12-03 MED ORDER — HYDROCODONE-ACETAMINOPHEN 5-325 MG PO TABS: 1.0000 | ORAL_TABLET | Freq: Four times a day (QID) | ORAL | Status: DC | PRN

## 2015-12-03 NOTE — Discharge Instructions (Signed)
Nonspecific Chest Pain  °Chest pain can be caused by many different conditions. There is always a chance that your pain could be related to something serious, such as a heart attack or a blood clot in your lungs. Chest pain can also be caused by conditions that are not life-threatening. If you have chest pain, it is very important to follow up with your health care provider. °CAUSES  °Chest pain can be caused by: °· Heartburn. °· Pneumonia or bronchitis. °· Anxiety or stress. °· Inflammation around your heart (pericarditis) or lung (pleuritis or pleurisy). °· A blood clot in your lung. °· A collapsed lung (pneumothorax). It can develop suddenly on its own (spontaneous pneumothorax) or from trauma to the chest. °· Shingles infection (varicella-zoster virus). °· Heart attack. °· Damage to the bones, muscles, and cartilage that make up your chest wall. This can include: °¨ Bruised bones due to injury. °¨ Strained muscles or cartilage due to frequent or repeated coughing or overwork. °¨ Fracture to one or more ribs. °¨ Sore cartilage due to inflammation (costochondritis). °RISK FACTORS  °Risk factors for chest pain may include: °· Activities that increase your risk for trauma or injury to your chest. °· Respiratory infections or conditions that cause frequent coughing. °· Medical conditions or overeating that can cause heartburn. °· Heart disease or family history of heart disease. °· Conditions or health behaviors that increase your risk of developing a blood clot. °· Having had chicken pox (varicella zoster). °SIGNS AND SYMPTOMS °Chest pain can feel like: °· Burning or tingling on the surface of your chest or deep in your chest. °· Crushing, pressure, aching, or squeezing pain. °· Dull or sharp pain that is worse when you move, cough, or take a deep breath. °· Pain that is also felt in your back, neck, shoulder, or arm, or pain that spreads to any of these areas. °Your chest pain may come and go, or it may stay  constant. °DIAGNOSIS °Lab tests or other studies may be needed to find the cause of your pain. Your health care provider may have you take a test called an ambulatory ECG (electrocardiogram). An ECG records your heartbeat patterns at the time the test is performed. You may also have other tests, such as: °· Transthoracic echocardiogram (TTE). During echocardiography, sound waves are used to create a picture of all of the heart structures and to look at how blood flows through your heart. °· Transesophageal echocardiogram (TEE). This is a more advanced imaging test that obtains images from inside your body. It allows your health care provider to see your heart in finer detail. °· Cardiac monitoring. This allows your health care provider to monitor your heart rate and rhythm in real time. °· Holter monitor. This is a portable device that records your heartbeat and can help to diagnose abnormal heartbeats. It allows your health care provider to track your heart activity for several days, if needed. °· Stress tests. These can be done through exercise or by taking medicine that makes your heart beat more quickly. °· Blood tests. °· Imaging tests. °TREATMENT  °Your treatment depends on what is causing your chest pain. Treatment may include: °· Medicines. These may include: °¨ Acid blockers for heartburn. °¨ Anti-inflammatory medicine. °¨ Pain medicine for inflammatory conditions. °¨ Antibiotic medicine, if an infection is present. °¨ Medicines to dissolve blood clots. °¨ Medicines to treat coronary artery disease. °· Supportive care for conditions that do not require medicines. This may include: °¨ Resting. °¨ Applying heat   or cold packs to injured areas. °¨ Limiting activities until pain decreases. °HOME CARE INSTRUCTIONS °· If you were prescribed an antibiotic medicine, finish it all even if you start to feel better. °· Avoid any activities that bring on chest pain. °· Do not use any tobacco products, including  cigarettes, chewing tobacco, or electronic cigarettes. If you need help quitting, ask your health care provider. °· Do not drink alcohol. °· Take medicines only as directed by your health care provider. °· Keep all follow-up visits as directed by your health care provider. This is important. This includes any further testing if your chest pain does not go away. °· If heartburn is the cause for your chest pain, you may be told to keep your head raised (elevated) while sleeping. This reduces the chance that acid will go from your stomach into your esophagus. °· Make lifestyle changes as directed by your health care provider. These may include: °¨ Getting regular exercise. Ask your health care provider to suggest some activities that are safe for you. °¨ Eating a heart-healthy diet. A registered dietitian can help you to learn healthy eating options. °¨ Maintaining a healthy weight. °¨ Managing diabetes, if necessary. °¨ Reducing stress. °SEEK MEDICAL CARE IF: °· Your chest pain does not go away after treatment. °· You have a rash with blisters on your chest. °· You have a fever. °SEEK IMMEDIATE MEDICAL CARE IF:  °· Your chest pain is worse. °· You have an increasing cough, or you cough up blood. °· You have severe abdominal pain. °· You have severe weakness. °· You faint. °· You have chills. °· You have sudden, unexplained chest discomfort. °· You have sudden, unexplained discomfort in your arms, back, neck, or jaw. °· You have shortness of breath at any time. °· You suddenly start to sweat, or your skin gets clammy. °· You feel nauseous or you vomit. °· You suddenly feel light-headed or dizzy. °· Your heart begins to beat quickly, or it feels like it is skipping beats. °These symptoms may represent a serious problem that is an emergency. Do not wait to see if the symptoms will go away. Get medical help right away. Call your local emergency services (911 in the [place] Do not drive yourself to the hospital. °  °This  information is not intended to replace advice given to you by your health care provider. Make sure you discuss any questions you have with your health care provider. °  °Document Released: 08/18/2005 Document Revised: 11/29/2014 Document Reviewed: 06/14/2014 °Elsevier Interactive Patient Education ©2016 Elsevier Inc. ° °

## 2015-12-03 NOTE — ED Provider Notes (Signed)
CSN: [id number]     Arrival date & time 12/02/15  1953 History  By signing my name below, I, Danielle Townsend, attest that this documentation has been prepared under the direction and in the presence of Shon Baton, MD . Electronically Signed: [name], Scribe. 12/03/2015. 12:52 AM.    Chief Complaint  Patient presents with  . Chest Pain   The history is provided by the patient. No language interpreter was used.   HPI Comments:  Danielle Townsend is a 30 y.o. female with a history of PE and tubal ligation, who presents to the Emergency Department complaining of constant 10/10 left lower CP x ~ 2 weeks. She reports associated nausea. She has been taking ibuprofen without relief. Pt notes h/o similar pain when pregnant 3 years ago; she was diagnosed with PE and was on blood thinners for ~ 1 year.  She denies recent injury. She also denies acute LE edema, vomiting, rash, dysuria, and hematuria.   Denies fever or cough.  Past Medical History  Diagnosis Date  . Headache   . Pulmonary emboli [facility])    Past Surgical History  Procedure Laterality Date  . C sections     Family History  Problem Relation Age of Onset  . Diabetes Brother   . Celiac disease Brother   . Multiple sclerosis Maternal Grandmother   . Cancer Maternal Grandfather     prostate CLL   . Diabetes Maternal Grandfather   . Cancer Paternal Grandmother     breast   . Diabetes Paternal Grandfather    Social History  Substance Use Topics  . Smoking status: Former Games developer  . Smokeless tobacco: Never Used  . Alcohol Use: 0.0 oz/week    0 Standard drinks or equivalent per week     Comment: socially    OB History    No data available     Review of Systems  Cardiovascular: Positive for chest pain.  Gastrointestinal: Positive for nausea. Negative for vomiting.  Genitourinary: Negative for dysuria and hematuria.  Skin: Negative for rash.  All other systems reviewed and are negative.  Allergies  Review of  patient's allergies indicates no known allergies.  Home Medications   Prior to Admission medications   Medication Sig Start Date End Date Taking? Authorizing Provider  ALPRAZolam Prudy Feeler) 0.25 MG tablet Take 0.25 mg by mouth daily as needed for anxiety.  06/10/15   Historical Provider, MD  amitriptyline (ELAVIL) 10 MG tablet Take 1 tablet (10 mg total) by mouth at bedtime. Patient not taking: Reported on 09/13/2015 07/04/15   Drema Dallas, DO  cyclobenzaprine (FLEXERIL) 10 MG tablet Take 1 tablet (10 mg total) by mouth 2 (two) times daily as needed for muscle spasms. 09/13/15   Gwyneth Sprout, MD  HYDROcodone-acetaminophen (NORCO/VICODIN) 5-325 MG tablet Take 1 tablet by mouth every 6 (six) hours as needed. 12/03/15   Shon Baton, MD  ibuprofen (ADVIL,MOTRIN) 800 MG tablet Take 800 mg by mouth every 8 (eight) hours as needed (pain).  05/20/15   Historical Provider, MD  lidocaine (LIDODERM) 5 % Place 1 patch onto the skin daily. Remove & Discard patch within 12 hours or as directed by MD 09/13/15   Gwyneth Sprout, MD  naproxen (NAPROSYN) 500 MG tablet Take 1 tablet (500 mg total) by mouth 2 (two) times daily. 12/03/15   Shon Baton, MD  Naproxen Sodium (ALEVE PO) Take 1 tablet by mouth 2 (two) times daily as needed (pain).    Historical Provider,  MD   BP 109/65 mmHg  Pulse 85  Temp(Src) 98.7 F (37.1 C) (Oral)  Resp 19  Ht 5\' 5"  (1.651 m)  Wt 180 lb (81.647 kg)  BMI 29.95 kg/m2  SpO2 98%  LMP 11/07/2015 Physical Exam  Constitutional: She is oriented to person, place, and time. She appears well-developed and well-nourished. No distress.  HENT:  Head: Normocephalic and atraumatic.  Cardiovascular: Normal rate, regular rhythm and normal heart sounds.   Pulmonary/Chest: Effort normal. No respiratory distress. She has no wheezes. She exhibits tenderness.  Abdominal: Soft. Bowel sounds are normal.  Musculoskeletal: She exhibits no edema.  Neurological: She is alert and oriented  to person, place, and time.  Skin: Skin is warm and dry.  Psychiatric: She has a normal mood and affect.  Nursing note and vitals reviewed.   ED Course  Procedures   DIAGNOSTIC STUDIES:  Oxygen Saturation is 99% on RA, normal by my interpretation.    COORDINATION OF CARE:  12:50 AM Pt updated with results. Discussed treatment plan with pt at bedside and pt agreed to plan.  Labs Review Labs Reviewed  BASIC METABOLIC PANEL - Abnormal; Notable for the following:    Chloride 112 (*)    All other components within normal limits  CBC  URINALYSIS, ROUTINE W REFLEX MICROSCOPIC (NOT AT [facility])  Rosezena Sensor, ED  POC URINE PREG, ED    Imaging Review Dg Chest 2 View  12/02/2015  CLINICAL DATA:  Left-sided chest pain for 2 weeks. Shortness of breath for 2 days. EXAM: CHEST  2 VIEW COMPARISON:  Chest radiograph 03/28/2015, chest CT 11/07/2013 FINDINGS: Lung volumes are low on the PA view. The cardiomediastinal contours are normal. The lungs are clear. Pulmonary vasculature is normal. No consolidation, pleural effusion, or pneumothorax. No acute osseous abnormalities are seen. IMPRESSION: No acute pulmonary process. Electronically Signed   By: Rubye Oaks M.D.   On: 12/02/2015 20:55   Ct Angio Chest Pe W/cm &/or Wo Cm  12/03/2015  CLINICAL DATA:  Left-sided chest pain for 2 weeks. History of pulmonary embolus. EXAM: CT ANGIOGRAPHY CHEST WITH CONTRAST TECHNIQUE: Multidetector CT imaging of the chest was performed using the standard protocol during bolus administration of intravenous contrast. Multiplanar CT image reconstructions and MIPs were obtained to evaluate the vascular anatomy. CONTRAST:  OMNIPAQUE IOHEXOL 350 MG/ML SOLN COMPARISON:  Radiographs 1 day prior. Chest CT 11/07/2013, 07/24/2012 FINDINGS: There are no filling defects within the pulmonary arteries to suggest pulmonary embolus. The heart is normal in size. Thoracic aorta is normal in caliber. Fluid in the superior  pericardial recess, no pericardial effusion. No mediastinal or hilar adenopathy. No consolidation. 4 mm subpleural nodule in the right middle lobe is unchanged dating back to 2014 Ing consistent with benign process. No new pulmonary nodule. No pulmonary edema. No acute abnormality in the included upper abdomen. There are no acute or suspicious osseous abnormalities. Review of the MIP images confirms the above findings. IMPRESSION: 1. No pulmonary embolus or acute intrathoracic process. 2. Subpleural right middle lobe pulmonary nodule is unchanged dating back to 2014, consistent with benign process. Electronically Signed   By: Rubye Oaks M.D.   On: 12/03/2015 02:01   I have personally reviewed and evaluated these images and lab results as part of my medical decision-making.   EKG Interpretation   Date/Time:  Tuesday December 02 2015 20:04:16 EST Ventricular Rate:  78 PR Interval:  144 QRS Duration: 90 QT Interval:  358 QTC Calculation: 408 R Axis:  65 Text Interpretation:  Normal sinus rhythm with sinus arrhythmia Normal ECG  no prior for comparison Confirmed by Shea Kapur  MD, Raylan Hanton ([id number]) on  12/03/2015 12:42:15 AM      MDM   Final diagnoses:  Other chest pain    Patient presents with chest pain. History of PE in the setting of pregnancy. She is off anticoagulation. Regarding additional risk factors for PE, patient is otherwise low risk. Prior PE was provoked. She feels that this pain is similar. Discussed with patient possible routes of workup including a d-dimer initially versus immediate CT. Patient has elected for CT imaging given her history and similar pain. Feel this is reasonable. CT scan was obtained and is negative. Patient's workup was otherwise reassuring with normal EKG and chest x-ray. She does have some reproducible pain on exam. Discussed patient anti-inflammatory medication at home and follow-up with primary physician.  After history, exam, and medical workup I feel  the patient has been appropriately medically screened and is safe for discharge home. Pertinent diagnoses were discussed with the patient. Patient was given return precautions.   I personally performed the services described in this documentation, which was scribed in my presence. The recorded information has been reviewed and is accurate.    Shon Batonourtney F Hobert Poplaski, MD 12/03/15 2252

## 2015-12-03 NOTE — ED Notes (Signed)
Taken to CT scan

## 2015-12-03 NOTE — ED Notes (Signed)
Dr. Horton at the bedside.  

## 2015-12-03 NOTE — ED Notes (Signed)
Family at bedside. 

## 2016-02-11 ENCOUNTER — Emergency Department (HOSPITAL_BASED_OUTPATIENT_CLINIC_OR_DEPARTMENT_OTHER)
Admission: EM | Admit: 2016-02-11 | Discharge: 2016-02-11 | Disposition: A | Discharge status: Home / Self Care | Payer: Self-pay | Attending: Emergency Medicine | Admitting: Emergency Medicine

## 2016-02-11 ENCOUNTER — Emergency Department (HOSPITAL_BASED_OUTPATIENT_CLINIC_OR_DEPARTMENT_OTHER): Payer: Self-pay

## 2016-02-11 ENCOUNTER — Encounter (HOSPITAL_BASED_OUTPATIENT_CLINIC_OR_DEPARTMENT_OTHER): Payer: Self-pay | Admitting: Emergency Medicine

## 2016-02-11 DIAGNOSIS — F419 Anxiety disorder, unspecified: Secondary | ICD-10-CM | POA: Insufficient documentation

## 2016-02-11 DIAGNOSIS — R002 Palpitations: Secondary | ICD-10-CM | POA: Insufficient documentation

## 2016-02-11 DIAGNOSIS — Z79899 Other long term (current) drug therapy: Secondary | ICD-10-CM | POA: Insufficient documentation

## 2016-02-11 DIAGNOSIS — R42 Dizziness and giddiness: Secondary | ICD-10-CM | POA: Insufficient documentation

## 2016-02-11 DIAGNOSIS — R0789 Other chest pain: Secondary | ICD-10-CM | POA: Insufficient documentation

## 2016-02-11 DIAGNOSIS — R05 Cough: Secondary | ICD-10-CM | POA: Insufficient documentation

## 2016-02-11 DIAGNOSIS — Z791 Long term (current) use of non-steroidal anti-inflammatories (NSAID): Secondary | ICD-10-CM | POA: Insufficient documentation

## 2016-02-11 DIAGNOSIS — Z86711 Personal history of pulmonary embolism: Secondary | ICD-10-CM | POA: Insufficient documentation

## 2016-02-11 DIAGNOSIS — R0602 Shortness of breath: Secondary | ICD-10-CM | POA: Insufficient documentation

## 2016-02-11 DIAGNOSIS — Z3202 Encounter for pregnancy test, result negative: Secondary | ICD-10-CM | POA: Insufficient documentation

## 2016-02-11 DIAGNOSIS — R112 Nausea with vomiting, unspecified: Secondary | ICD-10-CM | POA: Insufficient documentation

## 2016-02-11 LAB — CBC WITH DIFFERENTIAL/PLATELET
BASOS ABS: 0 10*3/uL (ref 0.0–0.1)
BASOS PCT: 0 %
Eosinophils Absolute: 0.2 10*3/uL (ref 0.0–0.7)
Eosinophils Relative: 3 %
HEMATOCRIT: 40.6 % (ref 36.0–46.0)
HEMOGLOBIN: 13.5 g/dL (ref 12.0–15.0)
LYMPHS PCT: 26 %
Lymphs Abs: 1.9 10*3/uL (ref 0.7–4.0)
MCH: 30.7 pg (ref 26.0–34.0)
MCHC: 33.3 g/dL (ref 30.0–36.0)
MCV: 92.3 fL (ref 78.0–100.0)
MONO ABS: 0.4 10*3/uL (ref 0.1–1.0)
Monocytes Relative: 5 %
NEUTROS ABS: 4.7 10*3/uL (ref 1.7–7.7)
NEUTROS PCT: 66 %
Platelets: 321 10*3/uL (ref 150–400)
RBC: 4.4 MIL/uL (ref 3.87–5.11)
RDW: 13.9 % (ref 11.5–15.5)
WBC: 7.2 10*3/uL (ref 4.0–10.5)

## 2016-02-11 LAB — COMPREHENSIVE METABOLIC PANEL
ALBUMIN: 4.2 g/dL (ref 3.5–5.0)
ALK PHOS: 91 U/L (ref 38–126)
ALT: 25 U/L (ref 14–54)
AST: 24 U/L (ref 15–41)
Anion gap: 9 (ref 5–15)
BILIRUBIN TOTAL: 0.4 mg/dL (ref 0.3–1.2)
BUN: 13 mg/dL (ref 6–20)
CO2: 22 mmol/L (ref 22–32)
CREATININE: 0.72 mg/dL (ref 0.44–1.00)
Calcium: 8.9 mg/dL (ref 8.9–10.3)
Chloride: 112 mmol/L — ABNORMAL HIGH (ref 101–111)
GFR calc Af Amer: >60 mL/min (ref >60)
GLUCOSE: 84 mg/dL (ref 65–99)
POTASSIUM: 3.5 mmol/L (ref 3.5–5.1)
Sodium: 143 mmol/L (ref 135–145)
TOTAL PROTEIN: 7.6 g/dL (ref 6.5–8.1)

## 2016-02-11 LAB — PREGNANCY, URINE: PREG TEST UR: NEGATIVE

## 2016-02-11 LAB — D-DIMER, QUANTITATIVE: D-Dimer, Quant: 0.31 ug/mL-FEU (ref 0.00–0.50)

## 2016-02-11 LAB — TROPONIN I

## 2016-02-11 MED ORDER — METHOCARBAMOL 500 MG PO TABS
1000.0000 mg | ORAL_TABLET | Freq: Once | ORAL | Status: AC
  Administered 2016-02-11: 1000 mg via ORAL
  Filled 2016-02-11: qty 2

## 2016-02-11 MED ORDER — IBUPROFEN 600 MG PO TABS: 600.0000 mg | ORAL_TABLET | Freq: Three times a day (TID) | ORAL | Status: DC

## 2016-02-11 MED ORDER — METHOCARBAMOL 500 MG PO TABS: 1000.0000 mg | ORAL_TABLET | Freq: Three times a day (TID) | ORAL | Status: DC | PRN

## 2016-02-11 MED ORDER — KETOROLAC TROMETHAMINE 60 MG/2ML IM SOLN
60.0000 mg | Freq: Once | INTRAMUSCULAR | Status: AC
  Administered 2016-02-11: 60 mg via INTRAMUSCULAR
  Filled 2016-02-11: qty 2

## 2016-02-11 NOTE — Discharge Instructions (Signed)
Chest Wall Pain °Chest wall pain is pain in or around the bones and muscles of your chest. Sometimes, an injury causes this pain. Sometimes, the cause may not be known. This pain may take several weeks or longer to get better. °HOME CARE INSTRUCTIONS  °Pay attention to any changes in your symptoms. Take these actions to help with your pain:  °· Rest as told by your health care provider.   °· Avoid activities that cause pain. These include any activities that use your chest muscles or your abdominal and side muscles to lift heavy items.    °· If directed, apply ice to the painful area: °· Put ice in a plastic bag. °· Place a towel between your skin and the bag. °· Leave the ice on for 20 minutes, 2-3 times per day. °· Take over-the-counter and prescription medicines only as told by your health care provider. °· Do not use tobacco products, including cigarettes, chewing tobacco, and e-cigarettes. If you need help quitting, ask your health care provider. °· Keep all follow-up visits as told by your health care provider. This is important. °SEEK MEDICAL CARE IF: °· You have a fever. °· Your chest pain becomes worse. °· You have new symptoms. °SEEK IMMEDIATE MEDICAL CARE IF: °· You have nausea or vomiting. °· You feel sweaty or light-headed. °· You have a cough with phlegm (sputum) or you cough up blood. °· You develop shortness of breath. °  °This information is not intended to replace advice given to you by your health care provider. Make sure you discuss any questions you have with your health care provider. °  °Document Released: 11/08/2005 Document Revised: 07/30/2015 Document Reviewed: 02/03/2015 °Elsevier Interactive Patient Education ©2016 [employer] ° °Panic Attacks °Panic attacks are sudden, short-lived surges of severe anxiety, fear, or discomfort. They may occur for no reason when you are relaxed, when you are anxious, or when you are sleeping. Panic attacks may occur for a number of reasons:  °· Healthy  people occasionally have panic attacks in extreme, life-threatening situations, such as war or natural disasters. Normal anxiety is a protective mechanism of the body that helps us react to danger (fight or flight response). °· Panic attacks are often seen with anxiety disorders, such as panic disorder, social anxiety disorder, generalized anxiety disorder, and phobias. Anxiety disorders cause excessive or uncontrollable anxiety. They may interfere with your relationships or other life activities. °· Panic attacks are sometimes seen with other mental illnesses, such as depression and posttraumatic stress disorder. °· Certain medical conditions, prescription medicines, and drugs of abuse can cause panic attacks. °SYMPTOMS  °Panic attacks start suddenly, peak within 20 minutes, and are accompanied by four or more of the following symptoms: °· Pounding heart or fast heart rate (palpitations). °· Sweating. °· Trembling or shaking. °· Shortness of breath or feeling smothered. °· Feeling choked. °· Chest pain or discomfort. °· Nausea or strange feeling in your stomach. °· Dizziness, light-headedness, or feeling like you will faint. °· Chills or hot flushes. °· Numbness or tingling in your lips or hands and feet. °· Feeling that things are not real or feeling that you are not yourself. °· Fear of losing control or going crazy. °· Fear of dying. °Some of these symptoms can mimic serious medical conditions. For example, you may think you are having a heart attack. Although panic attacks can be very scary, they are not life threatening. °DIAGNOSIS  °Panic attacks are diagnosed through an assessment by your health care provider. Your health care   provider will ask questions about your symptoms, such as where and when they occurred. Your health care provider will also ask about your medical history and use of alcohol and drugs, including prescription medicines. Your health care provider may order blood tests or other studies to  rule out a serious medical condition. Your health care provider may refer you to a mental health professional for further evaluation. °TREATMENT  °· Most healthy people who have one or two panic attacks in an extreme, life-threatening situation will not require treatment. °· The treatment for panic attacks associated with anxiety disorders or other mental illness typically involves counseling with a mental health professional, medicine, or a combination of both. Your health care provider will help determine what treatment is best for you. °· Panic attacks due to physical illness usually go away with treatment of the illness. If prescription medicine is causing panic attacks, talk with your health care provider about stopping the medicine, decreasing the dose, or substituting another medicine. °· Panic attacks due to alcohol or drug abuse go away with abstinence. Some adults need professional help in order to stop drinking or using drugs. °HOME CARE INSTRUCTIONS  °· Take all medicines as directed by your health care provider.   °· Schedule and attend follow-up visits as directed by your health care provider. It is important to keep all your appointments. °SEEK MEDICAL CARE IF: °· You are not able to take your medicines as prescribed. °· Your symptoms do not improve or get worse. °SEEK IMMEDIATE MEDICAL CARE IF:  °· You experience panic attack symptoms that are different than your usual symptoms. °· You have serious thoughts about hurting yourself or others. °· You are taking medicine for panic attacks and have a serious side effect. °MAKE SURE YOU: °· Understand these instructions. °· Will watch your condition. °· Will get help right away if you are not doing well or get worse. °  °This information is not intended to replace advice given to you by your health care provider. Make sure you discuss any questions you have with your health care provider. °  °Document Released: 11/08/2005 Document Revised: 11/13/2013  Document Reviewed: 06/22/2013 °Elsevier Interactive Patient Education ©2016 Elsevier Inc. ° °

## 2016-02-11 NOTE — ED Notes (Signed)
Patient reports that she has been having intermittent chest pain x months. The patient reports that is feels like her "heart is stopping". Then she had a feeling of being lightheaded and SOB with nausea. The patient is tearful and crying and anxious in triage. Patient reports that today this started while lying down.

## 2016-02-11 NOTE — ED Provider Notes (Signed)
CSN: [id number]     Arrival date & time 02/11/16  1436 History   First MD Initiated Contact with Patient 02/11/16 1620     Chief Complaint  Patient presents with  . Chest Pain     (Consider location/radiation/quality/duration/timing/severity/associated sxs/prior Treatment) HPI Patient presents with intermittent left-sided chest pain for the past several months. Episode onset today started around noon. States she was lying down when it started. She began having palpitations and irregular heartbeat. She also noted shortness of breath with nausea. She became quite anxious and tearful. States she is feeling better currently. She continues to have left-sided chest pain though not as severe as prior to presentation. She had a PE 3 years ago during pregnancy. She has no new lower extremity swelling or tenderness. No recent extended travel or immobility. No known trauma or heavy lifting. Patient says she's had a mild cough onset today. No fever or chills. Was seen in January for similar presentation. Had CT angio chest without evidence of PE. Past Medical History  Diagnosis Date  . Headache   . Pulmonary emboli [facility])    Past Surgical History  Procedure Laterality Date  . C sections     Family History  Problem Relation Age of Onset  . Diabetes Brother   . Celiac disease Brother   . Multiple sclerosis Maternal Grandmother   . Cancer Maternal Grandfather     prostate CLL   . Diabetes Maternal Grandfather   . Cancer Paternal Grandmother     breast   . Diabetes Paternal Grandfather    Social History  Substance Use Topics  . Smoking status: Former Games developer  . Smokeless tobacco: Never Used  . Alcohol Use: 0.0 oz/week    0 Standard drinks or equivalent per week     Comment: socially    OB History    No data available     Review of Systems  Constitutional: Negative for fever and chills.  Respiratory: Positive for cough and shortness of breath.   Cardiovascular: Positive for chest pain  and palpitations. Negative for leg swelling.  Gastrointestinal: Positive for nausea. Negative for vomiting, abdominal pain and diarrhea.  Genitourinary: Negative for dysuria and flank pain.  Musculoskeletal: Negative for back pain, neck pain and neck stiffness.  Skin: Negative for rash and wound.  Neurological: Positive for light-headedness. Negative for dizziness, syncope, weakness, numbness and headaches.  All other systems reviewed and are negative.     Allergies  Review of patient's allergies indicates no known allergies.  Home Medications   Prior to Admission medications   Medication Sig Start Date End Date Taking? Authorizing Provider  ALPRAZolam Prudy Feeler) 0.25 MG tablet Take 0.25 mg by mouth daily as needed for anxiety.  06/10/15   Historical Provider, MD  amitriptyline (ELAVIL) 10 MG tablet Take 1 tablet (10 mg total) by mouth at bedtime. Patient not taking: Reported on 09/13/2015 07/04/15   Drema Dallas, DO  cyclobenzaprine (FLEXERIL) 10 MG tablet Take 1 tablet (10 mg total) by mouth 2 (two) times daily as needed for muscle spasms. 09/13/15   Gwyneth Sprout, MD  HYDROcodone-acetaminophen (NORCO/VICODIN) 5-325 MG tablet Take 1 tablet by mouth every 6 (six) hours as needed. 12/03/15   Shon Baton, MD  ibuprofen (ADVIL,MOTRIN) 600 MG tablet Take 1 tablet (600 mg total) by mouth 3 (three) times daily after meals. 02/11/16   Loren Racer, MD  lidocaine (LIDODERM) 5 % Place 1 patch onto the skin daily. Remove & Discard patch within 12 hours  or as directed by MD 09/13/15   Gwyneth SproutWhitney Plunkett, MD  methocarbamol (ROBAXIN) 500 MG tablet Take 2 tablets (1,000 mg total) by mouth every 8 (eight) hours as needed for muscle spasms. 02/11/16   Loren Raceravid Tanasia Budzinski, MD  naproxen (NAPROSYN) 500 MG tablet Take 1 tablet (500 mg total) by mouth 2 (two) times daily. 12/03/15   Shon Batonourtney F Horton, MD  Naproxen Sodium (ALEVE PO) Take 1 tablet by mouth 2 (two) times daily as needed (pain).    Historical  Provider, MD   BP 112/96 mmHg  Pulse 74  Temp(Src) 97.8 F (36.6 C) (Oral)  Resp 17  Ht 5\' 4"  (1.626 m)  Wt 180 lb (81.647 kg)  BMI 30.88 kg/m2  SpO2 100%  LMP 01/09/2016 Physical Exam  Constitutional: She is oriented to person, place, and time. She appears well-developed and well-nourished. No distress.  Initially calm but then becomes mildly tearful  HENT:  Head: Normocephalic and atraumatic.  Mouth/Throat: Oropharynx is clear and moist. No oropharyngeal exudate.  Eyes: EOM are normal. Pupils are equal, round, and reactive to light.  Neck: Normal range of motion. Neck supple. No JVD present.  Cardiovascular: Normal rate and regular rhythm.  Exam reveals no Majer and no friction rub.   No murmur heard. Pulmonary/Chest: Effort normal and breath sounds normal. No respiratory distress. She has no wheezes. She has no rales. She exhibits tenderness (chest wall tenderness in the left upper  Parasternal region. There is no crepitance or deformity.).  Abdominal: Soft. Bowel sounds are normal. She exhibits no distension and no mass. There is no tenderness. There is no rebound and no guarding.  Musculoskeletal: Normal range of motion. She exhibits no edema or tenderness.  No CVA tenderness to percussion. No lower extremity swelling, asymmetry or tenderness. Distal pulses equal  Neurological: She is alert and oriented to person, place, and time.  Moves all extremities without deficit. Sensation is fully intact.  Skin: Skin is warm and dry. No rash noted. No erythema.  Psychiatric: She has a normal mood and affect. Her behavior is normal.  Nursing note and vitals reviewed.   ED Course  Procedures (including critical care time) Labs Review Labs Reviewed  COMPREHENSIVE METABOLIC PANEL - Abnormal; Notable for the following:    Chloride 112 (*)    All other components within normal limits  CBC WITH DIFFERENTIAL/PLATELET  TROPONIN I  D-DIMER, QUANTITATIVE (NOT AT [facility])  PREGNANCY, URINE     Imaging Review Dg Chest 2 View  02/11/2016  CLINICAL DATA:  Chest pain EXAM: CHEST  2 VIEW COMPARISON:  12/02/2015 chest radiograph. FINDINGS: Stable cardiomediastinal silhouette with normal heart size. No pneumothorax. No pleural effusion. Lungs appear clear, with no acute consolidative airspace disease and no pulmonary edema. IMPRESSION: No active cardiopulmonary disease. Electronically Signed   By: Delbert PhenixJason A Poff M.D.   On: 02/11/2016 17:02   I have personally reviewed and evaluated these images and lab results as part of my medical decision-making.   EKG Interpretation   Date/Time:  Wednesday February 11 2016 14:49:32 EDT Ventricular Rate:  68 PR Interval:  140 QRS Duration: 88 QT Interval:  388 QTC Calculation: 412 R Axis:   55 Text Interpretation:  Normal sinus rhythm Normal ECG No significant change  since last tracing Confirmed by [facility]  MD, MARTHA [phone]([id number]) on 02/11/2016  2:53:35 PM      MDM   Final diagnoses:  Left-sided chest wall pain  Anxiety    D-dimer is normal. EKG without any acute abnormalities.  It is reproducible. Likely symptoms due to chest wall pain that is exacerbating anxiety. Low suspicion for coronary artery disease. We'll treat with NSAIDs and muscle relaxant. Return precautions given.    Loren Racer, MD 02/11/16 [phone]

## 2016-10-15 ENCOUNTER — Emergency Department (HOSPITAL_BASED_OUTPATIENT_CLINIC_OR_DEPARTMENT_OTHER)
Admission: EM | Admit: 2016-10-15 | Discharge: 2016-10-16 | Disposition: A | Discharge status: Home / Self Care | Payer: Self-pay | Attending: Emergency Medicine | Admitting: Emergency Medicine

## 2016-10-15 ENCOUNTER — Encounter (HOSPITAL_BASED_OUTPATIENT_CLINIC_OR_DEPARTMENT_OTHER): Payer: Self-pay

## 2016-10-15 DIAGNOSIS — R1011 Right upper quadrant pain: Secondary | ICD-10-CM | POA: Insufficient documentation

## 2016-10-15 DIAGNOSIS — R112 Nausea with vomiting, unspecified: Secondary | ICD-10-CM | POA: Insufficient documentation

## 2016-10-15 DIAGNOSIS — F17200 Nicotine dependence, unspecified, uncomplicated: Secondary | ICD-10-CM | POA: Insufficient documentation

## 2016-10-15 LAB — PREGNANCY, URINE: Preg Test, Ur: NEGATIVE

## 2016-10-15 LAB — URINALYSIS, ROUTINE W REFLEX MICROSCOPIC
Bilirubin Urine: NEGATIVE
GLUCOSE, UA: NEGATIVE mg/dL
Hgb urine dipstick: NEGATIVE
Ketones, ur: NEGATIVE mg/dL
LEUKOCYTES UA: NEGATIVE
Nitrite: NEGATIVE
PH: 5.5 (ref 5.0–8.0)
Protein, ur: NEGATIVE mg/dL
SPECIFIC GRAVITY, URINE: 1.028 (ref 1.005–1.030)

## 2016-10-15 NOTE — ED Triage Notes (Signed)
C/o abd pain, n/v, weight loss x 2 months-no medical treatment-NAD-steady gait

## 2016-10-16 ENCOUNTER — Inpatient Hospital Stay (HOSPITAL_BASED_OUTPATIENT_CLINIC_OR_DEPARTMENT_OTHER): Admit: 2016-10-16 | Payer: Self-pay

## 2016-10-16 LAB — CBC WITH DIFFERENTIAL/PLATELET
BASOS PCT: 1 %
Basophils Absolute: 0.1 10*3/uL (ref 0.0–0.1)
EOS ABS: 0.3 10*3/uL (ref 0.0–0.7)
Eosinophils Relative: 4 %
HEMATOCRIT: 40.4 % (ref 36.0–46.0)
HEMOGLOBIN: 13.3 g/dL (ref 12.0–15.0)
LYMPHS ABS: 2.9 10*3/uL (ref 0.7–4.0)
Lymphocytes Relative: 32 %
MCH: 30.9 pg (ref 26.0–34.0)
MCHC: 32.9 g/dL (ref 30.0–36.0)
MCV: 94 fL (ref 78.0–100.0)
MONOS PCT: 7 %
Monocytes Absolute: 0.6 10*3/uL (ref 0.1–1.0)
NEUTROS ABS: 4.9 10*3/uL (ref 1.7–7.7)
NEUTROS PCT: 56 %
Platelets: 339 10*3/uL (ref 150–400)
RBC: 4.3 MIL/uL (ref 3.87–5.11)
RDW: 14.1 % (ref 11.5–15.5)
WBC: 8.8 10*3/uL (ref 4.0–10.5)

## 2016-10-16 LAB — COMPREHENSIVE METABOLIC PANEL
ALBUMIN: 4.1 g/dL (ref 3.5–5.0)
ALK PHOS: 81 U/L (ref 38–126)
ALT: 28 U/L (ref 14–54)
AST: 30 U/L (ref 15–41)
Anion gap: 7 (ref 5–15)
BILIRUBIN TOTAL: 0.2 mg/dL — AB (ref 0.3–1.2)
BUN: 11 mg/dL (ref 6–20)
CALCIUM: 9.1 mg/dL (ref 8.9–10.3)
CO2: 23 mmol/L (ref 22–32)
CREATININE: 0.67 mg/dL (ref 0.44–1.00)
Chloride: 112 mmol/L — ABNORMAL HIGH (ref 101–111)
GFR calc Af Amer: >60 mL/min (ref >60)
GFR calc non Af Amer: >60 mL/min (ref >60)
GLUCOSE: 99 mg/dL (ref 65–99)
Potassium: 3.3 mmol/L — ABNORMAL LOW (ref 3.5–5.1)
SODIUM: 142 mmol/L (ref 135–145)
TOTAL PROTEIN: 7.5 g/dL (ref 6.5–8.1)

## 2016-10-16 LAB — LIPASE, BLOOD: Lipase: 24 U/L (ref 11–51)

## 2016-10-16 MED ORDER — ONDANSETRON 4 MG PO TBDP: ORAL_TABLET | ORAL | 0 refills | Status: DC

## 2016-10-16 MED ORDER — GI COCKTAIL ~~LOC~~
30.0000 mL | Freq: Once | ORAL | Status: AC
  Administered 2016-10-16: 30 mL via ORAL
  Filled 2016-10-16: qty 30

## 2016-10-16 MED ORDER — ONDANSETRON 4 MG PO TBDP
4.0000 mg | ORAL_TABLET | Freq: Once | ORAL | Status: AC
  Administered 2016-10-16: 4 mg via ORAL
  Filled 2016-10-16: qty 1

## 2016-10-16 NOTE — Discharge Instructions (Signed)
Try zantac 150mg  twice a day.   Return tomorrow for outpatient US. Or follow up with your family doc for an ultrasound.

## 2016-10-16 NOTE — ED Provider Notes (Signed)
[facility]   CSN: [id number] Arrival date & time: 10/15/16  2048     History   Chief Complaint Chief Complaint  Patient presents with  . Abdominal Pain    HPI Danielle Townsend is a 30 y.o. female.  30 yo F with a chief complaint of right upper quadrant abdominal pain. This been going on for at least 2 weeks. Is off and on worse with eating. Patient denies any fevers or chills has vomited at least once a day for the past 2 weeks. Denies dysuria or increased frequency or hesitancy. Denies vaginal discharge or vaginal bleeding. Denies likelihood being pregnant.   The history is provided by the patient and the spouse.  Abdominal Pain   This is a new problem. The current episode started more than 1 week ago. The problem occurs constantly. The problem has not changed since onset.The pain is associated with eating. The pain is located in the RUQ. The quality of the pain is aching and sharp. The pain is at a severity of 7/10. The pain is moderate. Associated symptoms include nausea and vomiting. Pertinent negatives include fever, dysuria, headaches, arthralgias and myalgias. Nothing aggravates the symptoms. Nothing relieves the symptoms.    Past Medical History:  Diagnosis Date  . Headache   . Pulmonary emboli [facility])     Patient Active Problem List   Diagnosis Date Noted  . Intractable migraine without aura and without status migrainosus 07/04/2015  . GAD (generalized anxiety disorder) 07/04/2015    Past Surgical History:  Procedure Laterality Date  . c sections    . CESAREAN SECTION      OB History    No data available       Home Medications    Prior to Admission medications   Medication Sig Start Date End Date Taking? Authorizing Provider  ondansetron (ZOFRAN ODT) 4 MG disintegrating tablet 4mg  ODT q4 hours prn nausea/vomit 10/16/16   Melene Plan, DO    Family History Family History  Problem Relation Age of Onset  . Diabetes Brother   .  Celiac disease Brother   . Multiple sclerosis Maternal Grandmother   . Cancer Maternal Grandfather     prostate CLL   . Diabetes Maternal Grandfather   . Cancer Paternal Grandmother     breast   . Diabetes Paternal Grandfather     Social History Social History  Substance Use Topics  . Smoking status: Current Some Day Smoker  . Smokeless tobacco: Never Used  . Alcohol use Yes     Comment: occ     Allergies   Patient has no known allergies.   Review of Systems Review of Systems  Constitutional: Negative for chills and fever.  HENT: Negative for congestion and rhinorrhea.   Eyes: Negative for redness and visual disturbance.  Respiratory: Negative for shortness of breath and wheezing.   Cardiovascular: Negative for chest pain and palpitations.  Gastrointestinal: Positive for abdominal pain, nausea and vomiting.  Genitourinary: Negative for dysuria and urgency.  Musculoskeletal: Negative for arthralgias and myalgias.  Skin: Negative for pallor and wound.  Neurological: Negative for dizziness and headaches.     Physical Exam Updated Vital Signs BP 110/72 (BP Location: Left Arm)   Pulse 69   Temp 98.3 F (36.8 C) (Oral)   Resp 16   Ht 5\' 4"  (1.626 m)   Wt 184 lb (83.5 kg)   LMP 10/08/2016   SpO2 99%   BMI 31.58 kg/m   Physical Exam  Constitutional: She is oriented to person, place, and time. She appears well-developed and well-nourished. No distress.  HENT:  Head: Normocephalic and atraumatic.  Eyes: EOM are normal. Pupils are equal, round, and reactive to light.  Neck: Normal range of motion. Neck supple.  Cardiovascular: Normal rate and regular rhythm.  Exam reveals no Pankonin and no friction rub.   No murmur heard. Pulmonary/Chest: Effort normal. She has no wheezes. She has no rales.  Abdominal: Soft. She exhibits no distension and no mass. There is tenderness (worst to the RUQ, negative murphys). There is no guarding.  Musculoskeletal: She exhibits no edema  or tenderness.  Neurological: She is alert and oriented to person, place, and time.  Skin: Skin is warm and dry. She is not diaphoretic.  Psychiatric: She has a normal mood and affect. Her behavior is normal.  Nursing note and vitals reviewed.    ED Treatments / Results  Labs (all labs ordered are listed, but only abnormal results are displayed) Labs Reviewed  URINALYSIS, ROUTINE W REFLEX MICROSCOPIC (NOT AT [facility]) - Abnormal; Notable for the following:       Result Value   APPearance CLOUDY (*)    All other components within normal limits  COMPREHENSIVE METABOLIC PANEL - Abnormal; Notable for the following:    Potassium 3.3 (*)    Chloride 112 (*)    Total Bilirubin 0.2 (*)    All other components within normal limits  PREGNANCY, URINE  CBC WITH DIFFERENTIAL/PLATELET  LIPASE, BLOOD    EKG  EKG Interpretation None       Radiology No results found.  Procedures Procedures (including critical care time)  Medications Ordered in ED Medications  gi cocktail (Maalox,Lidocaine,Donnatal) (30 mLs Oral Given 10/16/16 0020)  ondansetron (ZOFRAN-ODT) disintegrating tablet 4 mg (4 mg Oral Given 10/16/16 0021)     Initial Impression / Assessment and Plan / ED Course  I have reviewed the triage vital signs and the nursing notes.  Pertinent labs & imaging results that were available during my care of the patient were reviewed by me and considered in my medical decision making (see chart for details).  Clinical Course     30 yo F With right upper quadrant abdominal pain going on for the past 2 weeks. Feel this unlikely acute cholecystitis with the delayed course. Patient has no white count no fever. Will have her follow-up with her family physician for ultrasound. Start on zantac.   3:53 AM:  I have discussed the diagnosis/risks/treatment options with the patient and family and believe the pt to be eligible for discharge home to follow-up with PCP. We also discussed returning to  the ED immediately if new or worsening sx occur. We discussed the sx which are most concerning (e.g., sudden worsening pain, fever, inability to tolerate by mouth) that necessitate immediate return. Medications administered to the patient during their visit and any new prescriptions provided to the patient are listed below.  Medications given during this visit Medications  gi cocktail (Maalox,Lidocaine,Donnatal) (30 mLs Oral Given 10/16/16 0020)  ondansetron (ZOFRAN-ODT) disintegrating tablet 4 mg (4 mg Oral Given 10/16/16 0021)     The patient appears reasonably screen and/or stabilized for discharge and I doubt any other medical condition or other [facility] requiring further screening, evaluation, or treatment in the ED at this time prior to discharge.    Final Clinical Impressions(s) / ED Diagnoses   Final diagnoses:  Right upper quadrant abdominal pain    New Prescriptions Discharge Medication List as  of 10/16/2016  1:18 AM    START taking these medications   Details  ondansetron (ZOFRAN ODT) 4 MG disintegrating tablet 4mg  ODT q4 hours prn nausea/vomit, Print         Melene Planan Glenn Christo, DO 10/16/16 [phone]

## 2017-04-13 LAB — GLUCOSE, POCT (MANUAL RESULT ENTRY): POC Glucose: 105 mg/dl — AB (ref 70–99)

## 2018-03-14 ENCOUNTER — Emergency Department (HOSPITAL_BASED_OUTPATIENT_CLINIC_OR_DEPARTMENT_OTHER): Payer: Self-pay

## 2018-03-14 ENCOUNTER — Emergency Department (HOSPITAL_BASED_OUTPATIENT_CLINIC_OR_DEPARTMENT_OTHER)
Admission: EM | Admit: 2018-03-14 | Discharge: 2018-03-14 | Disposition: A | Discharge status: Home / Self Care | Payer: Self-pay | Attending: Emergency Medicine | Admitting: Emergency Medicine

## 2018-03-14 ENCOUNTER — Other Ambulatory Visit: Payer: Self-pay

## 2018-03-14 ENCOUNTER — Encounter (HOSPITAL_BASED_OUTPATIENT_CLINIC_OR_DEPARTMENT_OTHER): Payer: Self-pay | Admitting: *Deleted

## 2018-03-14 DIAGNOSIS — N390 Urinary tract infection, site not specified: Secondary | ICD-10-CM | POA: Insufficient documentation

## 2018-03-14 DIAGNOSIS — K8080 Other cholelithiasis without obstruction: Secondary | ICD-10-CM

## 2018-03-14 DIAGNOSIS — R05 Cough: Secondary | ICD-10-CM | POA: Insufficient documentation

## 2018-03-14 DIAGNOSIS — R0789 Other chest pain: Secondary | ICD-10-CM | POA: Insufficient documentation

## 2018-03-14 DIAGNOSIS — Z79899 Other long term (current) drug therapy: Secondary | ICD-10-CM | POA: Insufficient documentation

## 2018-03-14 DIAGNOSIS — F17210 Nicotine dependence, cigarettes, uncomplicated: Secondary | ICD-10-CM | POA: Insufficient documentation

## 2018-03-14 DIAGNOSIS — K8020 Calculus of gallbladder without cholecystitis without obstruction: Secondary | ICD-10-CM | POA: Insufficient documentation

## 2018-03-14 DIAGNOSIS — R0602 Shortness of breath: Secondary | ICD-10-CM | POA: Insufficient documentation

## 2018-03-14 LAB — BASIC METABOLIC PANEL
ANION GAP: 8 (ref 5–15)
BUN: 11 mg/dL (ref 6–20)
CO2: 22 mmol/L (ref 22–32)
Calcium: 8.8 mg/dL — ABNORMAL LOW (ref 8.9–10.3)
Chloride: 110 mmol/L (ref 101–111)
Creatinine, Ser: 0.69 mg/dL (ref 0.44–1.00)
Glucose, Bld: 88 mg/dL (ref 65–99)
POTASSIUM: 3.6 mmol/L (ref 3.5–5.1)
SODIUM: 140 mmol/L (ref 135–145)

## 2018-03-14 LAB — URINALYSIS, MICROSCOPIC (REFLEX)

## 2018-03-14 LAB — CBC WITH DIFFERENTIAL/PLATELET
BASOS ABS: 0.1 10*3/uL (ref 0.0–0.1)
BASOS PCT: 1 %
EOS ABS: 0.3 10*3/uL (ref 0.0–0.7)
Eosinophils Relative: 3 %
HCT: 39.1 % (ref 36.0–46.0)
HEMOGLOBIN: 13.3 g/dL (ref 12.0–15.0)
Lymphocytes Relative: 28 %
Lymphs Abs: 2.5 10*3/uL (ref 0.7–4.0)
MCH: 32 pg (ref 26.0–34.0)
MCHC: 34 g/dL (ref 30.0–36.0)
MCV: 94 fL (ref 78.0–100.0)
Monocytes Absolute: 0.5 10*3/uL (ref 0.1–1.0)
Monocytes Relative: 6 %
NEUTROS PCT: 62 %
Neutro Abs: 5.4 10*3/uL (ref 1.7–7.7)
PLATELETS: 293 10*3/uL (ref 150–400)
RBC: 4.16 MIL/uL (ref 3.87–5.11)
RDW: 13.2 % (ref 11.5–15.5)
WBC: 8.8 10*3/uL (ref 4.0–10.5)

## 2018-03-14 LAB — PREGNANCY, URINE: Preg Test, Ur: NEGATIVE

## 2018-03-14 LAB — TROPONIN I

## 2018-03-14 LAB — URINALYSIS, ROUTINE W REFLEX MICROSCOPIC
Bilirubin Urine: NEGATIVE
GLUCOSE, UA: NEGATIVE mg/dL
KETONES UR: NEGATIVE mg/dL
Nitrite: NEGATIVE
PH: 6 (ref 5.0–8.0)
Protein, ur: NEGATIVE mg/dL
Specific Gravity, Urine: <1.005 — ABNORMAL LOW (ref 1.005–1.030)

## 2018-03-14 LAB — D-DIMER, QUANTITATIVE (NOT AT ARMC)

## 2018-03-14 MED ORDER — DICYCLOMINE HCL 10 MG/ML IM SOLN
10.0000 mg | Freq: Once | INTRAMUSCULAR | Status: AC
  Administered 2018-03-14: 10 mg via INTRAMUSCULAR
  Filled 2018-03-14: qty 2

## 2018-03-14 MED ORDER — NAPROXEN 500 MG PO TABS: 500.0000 mg | ORAL_TABLET | Freq: Two times a day (BID) | ORAL | 0 refills | Status: AC

## 2018-03-14 MED ORDER — FAMOTIDINE IN NACL 20-0.9 MG/50ML-% IV SOLN
20.0000 mg | Freq: Once | INTRAVENOUS | Status: AC
  Administered 2018-03-14: 20 mg via INTRAVENOUS
  Filled 2018-03-14: qty 50

## 2018-03-14 MED ORDER — ONDANSETRON 4 MG PO TBDP: 4.0000 mg | ORAL_TABLET | Freq: Three times a day (TID) | ORAL | 0 refills | Status: AC | PRN

## 2018-03-14 MED ORDER — GI COCKTAIL ~~LOC~~
30.0000 mL | Freq: Once | ORAL | Status: AC
  Administered 2018-03-14: 30 mL via ORAL
  Filled 2018-03-14: qty 30

## 2018-03-14 MED ORDER — DICYCLOMINE HCL 20 MG PO TABS: 20.0000 mg | ORAL_TABLET | Freq: Two times a day (BID) | ORAL | 0 refills | Status: DC

## 2018-03-14 MED ORDER — NITROFURANTOIN MONOHYD MACRO 100 MG PO CAPS: 100.0000 mg | ORAL_CAPSULE | Freq: Two times a day (BID) | ORAL | 0 refills | Status: DC

## 2018-03-14 NOTE — ED Provider Notes (Signed)
[facility] EMERGENCY DEPARTMENT Provider Note   CSN: [id number] Arrival date & time: 03/14/18  1814     History   Chief Complaint No chief complaint on file.   HPI Danielle Townsend is a 32 y.o. female with a past medical history of migraine headaches, prior PE diagnosed 5 years ago not currently on anticoagulation, who presents to ED for evaluation of multiple complaints.  Her first complaint is chest pressure, shortness of breath.  Symptoms have been going on for 2 weeks.  She reports intermittent dry cough but denies any hemoptysis, fever, recent surgeries, recent prolonged travel or leg swelling. Her next complaint is right upper quadrant/epigastric abdominal pain.  She states that the pain will sometimes be so severe that she has to "double over."  She also reports nausea but denies any vomiting.  Denies any changes in bowel movements but states that she had her last normal bowel movement 3 days ago which is typical for her due to her IBS. Her next complaint is possible UTI.  She has been experiencing dysuria and "cloudy" urine for the past 2 days.  Denies any flank pain, hematuria, vaginal complaints.  HPI  Past Medical History:  Diagnosis Date  . Headache   . Pulmonary emboli [facility])     Patient Active Problem List   Diagnosis Date Noted  . Intractable migraine without aura and without status migrainosus 07/04/2015  . GAD (generalized anxiety disorder) 07/04/2015    Past Surgical History:  Procedure Laterality Date  . c sections    . CESAREAN SECTION       OB History   None      Home Medications    Prior to Admission medications   Medication Sig Start Date End Date Taking? Authorizing Provider  dicyclomine (BENTYL) 20 MG tablet Take 1 tablet (20 mg total) by mouth 2 (two) times daily. 03/14/18   Lennard Capek, PA-C  naproxen (NAPROSYN) 500 MG tablet Take 1 tablet (500 mg total) by mouth 2 (two) times daily. 03/14/18   Zamiyah Resendes, PA-C  nitrofurantoin,  macrocrystal-monohydrate, (MACROBID) 100 MG capsule Take 1 capsule (100 mg total) by mouth 2 (two) times daily. 03/14/18   Adriell Polansky, PA-C  ondansetron (ZOFRAN ODT) 4 MG disintegrating tablet Take 1 tablet (4 mg total) by mouth every 8 (eight) hours as needed for nausea or vomiting. 03/14/18   Dietrich Pates, PA-C    Family History Family History  Problem Relation Age of Onset  . Diabetes Brother   . Celiac disease Brother   . Multiple sclerosis Maternal Grandmother   . Cancer Maternal Grandfather        prostate CLL   . Diabetes Maternal Grandfather   . Cancer Paternal Grandmother        breast   . Diabetes Paternal Grandfather     Social History Social History   Tobacco Use  . Smoking status: Current Some Day Smoker  . Smokeless tobacco: Never Used  Substance Use Topics  . Alcohol use: Yes    Comment: occ  . Drug use: No     Allergies   Patient has no known allergies.   Review of Systems Review of Systems  Constitutional: Negative for appetite change, chills and fever.  HENT: Negative for ear pain, rhinorrhea, sneezing and sore throat.   Eyes: Negative for photophobia and visual disturbance.  Respiratory: Positive for shortness of breath. Negative for cough, chest tightness and wheezing.   Cardiovascular: Positive for chest pain. Negative for palpitations.  Gastrointestinal: Positive for abdominal pain and nausea. Negative for blood in stool, constipation, diarrhea and vomiting.  Genitourinary: Positive for dysuria. Negative for hematuria and urgency.  Musculoskeletal: Negative for myalgias.  Skin: Negative for rash.  Neurological: Negative for dizziness, weakness and light-headedness.     Physical Exam Updated Vital Signs BP 112/78   Pulse 81   Temp 98.2 F (36.8 C) (Oral)   Resp 20   Ht 5\' 4"  (1.626 m)   Wt 77.1 kg (170 lb)   LMP 02/21/2018   SpO2 99%   BMI 29.18 kg/m   Physical Exam  Constitutional: She appears well-developed and well-nourished. No  distress.  Nontoxic appearing and in no acute distress.  HENT:  Head: Normocephalic and atraumatic.  Nose: Nose normal.  Eyes: Conjunctivae and EOM are normal. Right eye exhibits no discharge. Left eye exhibits no discharge. No scleral icterus.  Neck: Normal range of motion. Neck supple.  Cardiovascular: Normal rate, regular rhythm, normal heart sounds and intact distal pulses. Exam reveals no Broski and no friction rub.  No murmur heard. Pulmonary/Chest: Effort normal and breath sounds normal. No respiratory distress. She exhibits no tenderness.  Abdominal: Soft. Bowel sounds are normal. She exhibits no distension. There is tenderness (RUQ). There is no guarding.  Musculoskeletal: Normal range of motion. She exhibits no edema.  No lower extremity edema, erythema or calf tenderness bilaterally.  Neurological: She is alert. She exhibits normal muscle tone. Coordination normal.  Skin: Skin is warm and dry. No rash noted.  Psychiatric: She has a normal mood and affect.  Nursing note and vitals reviewed.    ED Treatments / Results  Labs (all labs ordered are listed, but only abnormal results are displayed) Labs Reviewed  URINALYSIS, ROUTINE W REFLEX MICROSCOPIC - Abnormal; Notable for the following components:      Result Value   Color, Urine STRAW (*)    APPearance HAZY (*)    Specific Gravity, Urine <1.005 (*)    Hgb urine dipstick TRACE (*)    Leukocytes, UA MODERATE (*)    All other components within normal limits  URINALYSIS, MICROSCOPIC (REFLEX) - Abnormal; Notable for the following components:   Bacteria, UA RARE (*)    All other components within normal limits  BASIC METABOLIC PANEL - Abnormal; Notable for the following components:   Calcium 8.8 (*)    All other components within normal limits  PREGNANCY, URINE  CBC WITH DIFFERENTIAL/PLATELET  D-DIMER, QUANTITATIVE (NOT AT [facility])  TROPONIN I    EKG EKG Interpretation  Date/Time:  Tuesday March 14 2018 18:28:52  EDT Ventricular Rate:  75 PR Interval:  146 QRS Duration: 84 QT Interval:  358 QTC Calculation: 399 R Axis:   55 Text Interpretation:  Normal sinus rhythm with sinus arrhythmia Normal ECG When compared to prior, no significant changes seen.  No STEMI Confirmed by Theda Belfast ([id number]) on 03/14/2018 6:57:40 PM   Radiology Dg Chest 2 View  Result Date: 03/14/2018 CLINICAL DATA:  Chest pain and shortness of breath EXAM: CHEST - 2 VIEW COMPARISON:  02/11/2016 FINDINGS: The heart size and mediastinal contours are within normal limits. Both lungs are clear. The visualized skeletal structures are unremarkable. IMPRESSION: No active cardiopulmonary disease. Electronically Signed   By: Alcide Clever M.D.   On: 03/14/2018 20:39   US Abdomen Limited  Result Date: 03/14/2018 CLINICAL DATA:  Right upper quadrant pain for 2 weeks EXAM: ULTRASOUND ABDOMEN LIMITED RIGHT UPPER QUADRANT COMPARISON:  01/08/2015 FINDINGS: Gallbladder: Decompressed with evidence of  cholelithiasis and small gallbladder polyps. No pericholecystic fluid is noted. Negative sonographic Eulah Pont sign is noted. Common bile duct: Diameter: 1.2 mm. Liver: No focal lesion identified. Within normal limits in parenchymal echogenicity. Portal vein is patent on color Doppler imaging with normal direction of blood flow towards the liver. IMPRESSION: Decompressed gallbladder with evidence of cholelithiasis and gallbladder polyps. No other focal abnormality is noted. Electronically Signed   By: Alcide Clever M.D.   On: 03/14/2018 20:56    Procedures Procedures (including critical care time)  Medications Ordered in ED Medications  famotidine (PEPCID) IVPB 20 mg premix (0 mg Intravenous Stopped 03/14/18 2202)  dicyclomine (BENTYL) injection 10 mg (10 mg Intramuscular Given 03/14/18 2145)  gi cocktail (Maalox,Lidocaine,Donnatal) (30 mLs Oral Given 03/14/18 2232)     Initial Impression / Assessment and Plan / ED Course  I have reviewed the triage  vital signs and the nursing notes.  Pertinent labs & imaging results that were available during my care of the patient were reviewed by me and considered in my medical decision making (see chart for details).     Patient presents to ED for evaluation of multiple complaints.  Her first complaint is chest pressure, shortness of breath.  Symptoms have been going on for 2 weeks.  She also reports intermittent dry cough.  She does have a history of PE diagnosed 5 years ago during her pregnancy.  She is not currently anticoagulated.  Denies any recent surgeries, recent prolonged travel, hemoptysis.  EKG shows normal sinus rhythm.  Troponin and d-dimer are unremarkable.  CBC, CMP unremarkable.  Chest x-ray negative. Her next complaint is right upper quadrant/epigastric abdominal pain.  She also reports nausea and vomiting associated with it.  She also complains of possible UTI as she is experiencing dysuria and "cloudy" urine for the past 2 days.  Urinalysis with leukocytes and bacteria but since patient is experiencing symptoms, will treat with antibiotics.  Right upper quadrant ultrasound shows cholelithiasis with no signs of cholecystitis or other infectious or inflammatory cause of symptoms.  She is afebrile.  Spoke to Dr. Ezzard Standing of general surgery who recommends that patient should follow-up outpatient for possible cholecystectomy.  No need for emergent cholecystectomy at this time.  Patient was given Bentyl, Pepcid, GI cocktail with improvement in her symptoms.  Will advise her to follow-up with surgery for further evaluation.  Doubt cardiac or pulmonary cause of her chest pain and most likely due to indigestion. She is not tachycardic, tachypnec or hypoxic. Will give antiemetics, Bentyl and advised over-the-counter antacids to help with symptoms.  Advised to return for any severe or worsening symptoms.   Portions of this note were generated with Scientist, clinical (histocompatibility and immunogenetics). Dictation errors may occur despite  best attempts at proofreading.   Final Clinical Impressions(s) / ED Diagnoses   Final diagnoses:  Biliary calculus of other site without obstruction  Lower urinary tract infectious disease    ED Discharge Orders        Ordered    naproxen (NAPROSYN) 500 MG tablet  2 times daily     03/14/18 2256    dicyclomine (BENTYL) 20 MG tablet  2 times daily     03/14/18 2256    ondansetron (ZOFRAN ODT) 4 MG disintegrating tablet  Every 8 hours PRN     03/14/18 2256    nitrofurantoin, macrocrystal-monohydrate, (MACROBID) 100 MG capsule  2 times daily     03/14/18 2306       Dietrich Pates, PA-C 03/14/18 2311  Tegeler, Canary Brimhristopher J, MD 03/14/18 2350

## 2018-03-14 NOTE — ED Triage Notes (Addendum)
Epigastric pain x 2 weeks. Pressure in her chest per pt. She feels like she may have a UTI.

## 2018-08-09 ENCOUNTER — Other Ambulatory Visit: Payer: Self-pay

## 2018-08-09 ENCOUNTER — Emergency Department (HOSPITAL_BASED_OUTPATIENT_CLINIC_OR_DEPARTMENT_OTHER)
Admission: EM | Admit: 2018-08-09 | Discharge: 2018-08-09 | Disposition: A | Discharge status: Home / Self Care | Payer: Self-pay | Attending: Emergency Medicine | Admitting: Emergency Medicine

## 2018-08-09 ENCOUNTER — Emergency Department (HOSPITAL_BASED_OUTPATIENT_CLINIC_OR_DEPARTMENT_OTHER): Payer: Self-pay

## 2018-08-09 ENCOUNTER — Encounter (HOSPITAL_BASED_OUTPATIENT_CLINIC_OR_DEPARTMENT_OTHER): Payer: Self-pay | Admitting: *Deleted

## 2018-08-09 DIAGNOSIS — R0789 Other chest pain: Secondary | ICD-10-CM | POA: Insufficient documentation

## 2018-08-09 DIAGNOSIS — G933 Postviral fatigue syndrome: Secondary | ICD-10-CM | POA: Insufficient documentation

## 2018-08-09 DIAGNOSIS — J069 Acute upper respiratory infection, unspecified: Secondary | ICD-10-CM | POA: Insufficient documentation

## 2018-08-09 DIAGNOSIS — Z87891 Personal history of nicotine dependence: Secondary | ICD-10-CM | POA: Insufficient documentation

## 2018-08-09 DIAGNOSIS — G9331 Postviral fatigue syndrome: Secondary | ICD-10-CM

## 2018-08-09 DIAGNOSIS — Z86711 Personal history of pulmonary embolism: Secondary | ICD-10-CM | POA: Insufficient documentation

## 2018-08-09 LAB — CBC WITH DIFFERENTIAL/PLATELET
Basophils Absolute: 0 10*3/uL (ref 0.0–0.1)
Basophils Relative: 0 %
EOS ABS: 0 10*3/uL (ref 0.0–0.7)
Eosinophils Relative: 0 %
HCT: 39.6 % (ref 36.0–46.0)
HEMOGLOBIN: 13.1 g/dL (ref 12.0–15.0)
LYMPHS PCT: 24 %
Lymphs Abs: 3 10*3/uL (ref 0.7–4.0)
MCH: 31.6 pg (ref 26.0–34.0)
MCHC: 33.1 g/dL (ref 30.0–36.0)
MCV: 95.4 fL (ref 78.0–100.0)
MONO ABS: 1 10*3/uL (ref 0.1–1.0)
Monocytes Relative: 8 %
NEUTROS ABS: 8.3 10*3/uL — AB (ref 1.7–7.7)
Neutrophils Relative %: 68 %
PLATELETS: 362 10*3/uL (ref 150–400)
RBC: 4.15 MIL/uL (ref 3.87–5.11)
RDW: 12.8 % (ref 11.5–15.5)
WBC: 12.3 10*3/uL — ABNORMAL HIGH (ref 4.0–10.5)

## 2018-08-09 LAB — BASIC METABOLIC PANEL
Anion gap: 9 (ref 5–15)
BUN: 18 mg/dL (ref 6–20)
CO2: 26 mmol/L (ref 22–32)
Calcium: 9 mg/dL (ref 8.9–10.3)
Chloride: 107 mmol/L (ref 98–111)
Creatinine, Ser: 0.9 mg/dL (ref 0.44–1.00)
GFR calc Af Amer: >60 mL/min (ref >60)
Glucose, Bld: 90 mg/dL (ref 70–99)
POTASSIUM: 3.5 mmol/L (ref 3.5–5.1)
SODIUM: 142 mmol/L (ref 135–145)

## 2018-08-09 LAB — TROPONIN I: Troponin I: <0.03 ng/mL (ref <0.03)

## 2018-08-09 LAB — D-DIMER, QUANTITATIVE (NOT AT ARMC)

## 2018-08-09 LAB — PREGNANCY, URINE: PREG TEST UR: NEGATIVE

## 2018-08-09 MED ORDER — ACETAMINOPHEN 500 MG PO TABS
1000.0000 mg | ORAL_TABLET | Freq: Once | ORAL | Status: AC
  Administered 2018-08-09: 1000 mg via ORAL
  Filled 2018-08-09: qty 2

## 2018-08-09 MED ORDER — SODIUM CHLORIDE 0.9 % IV BOLUS
1000.0000 mL | Freq: Once | INTRAVENOUS | Status: AC
  Administered 2018-08-09: 1000 mL via INTRAVENOUS

## 2018-08-09 MED ORDER — LORAZEPAM 2 MG/ML IJ SOLN
1.0000 mg | Freq: Once | INTRAMUSCULAR | Status: AC
  Administered 2018-08-09: 1 mg via INTRAVENOUS
  Filled 2018-08-09: qty 1

## 2018-08-09 MED ORDER — ALBUTEROL SULFATE HFA 108 (90 BASE) MCG/ACT IN AERS
2.0000 | INHALATION_SPRAY | Freq: Once | RESPIRATORY_TRACT | Status: AC
  Administered 2018-08-09: 2 via RESPIRATORY_TRACT
  Filled 2018-08-09: qty 6.7
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
## 2018-08-09 NOTE — ED Triage Notes (Addendum)
Pt c/o URI symptoms and chest pain  x 1 week , seen by PMD for same DX viral , URI, pt on ABX , pt is very anxious in triage and tearful HX of anxiety

## 2018-10-12 IMAGING — US US ABDOMEN LIMITED
1 series · 14 of 25 positions shown · non-contrast
Comparison: 01/08/2015

CLINICAL DATA: Right upper quadrant pain for 2 weeks

EXAM:
ULTRASOUND ABDOMEN LIMITED RIGHT UPPER QUADRANT

[Series 1: us abdomen limited · 0.15mm/px · 14 of 37 slices shown]
[im 1/37]
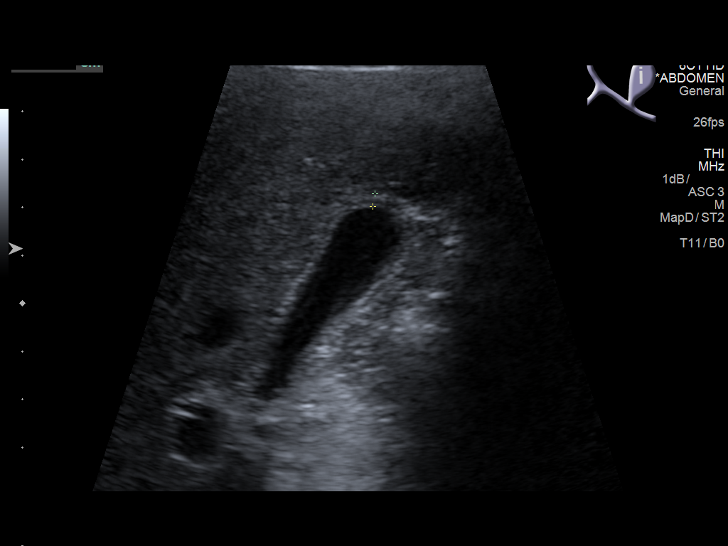
[im 4/37]
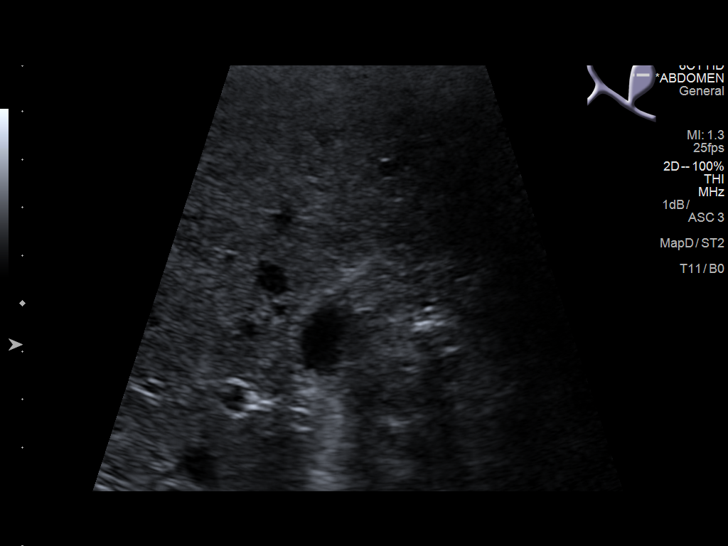
[im 7/37]
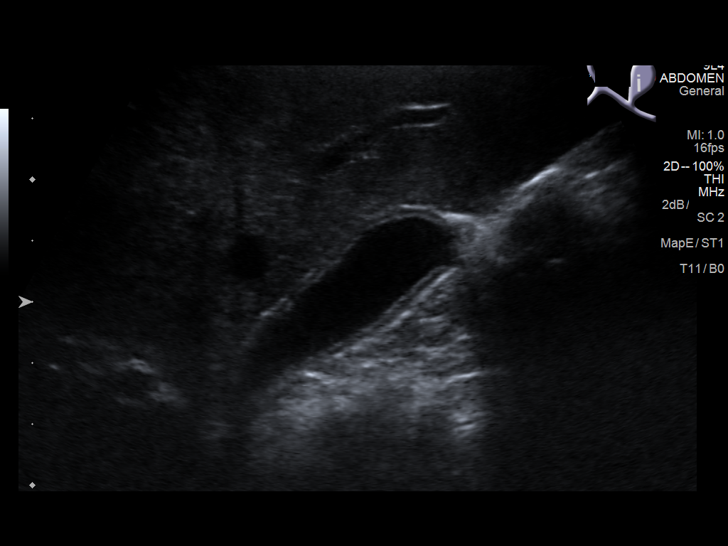
[im 10/37]
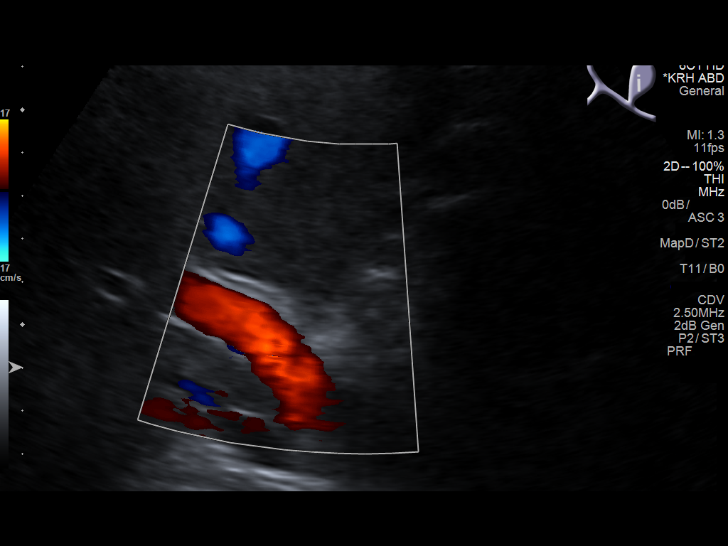
[im 13/37]
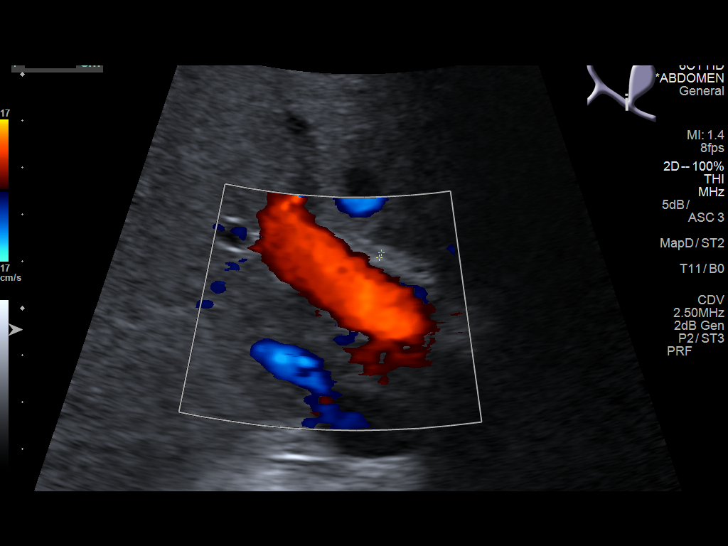
[im 14/37]
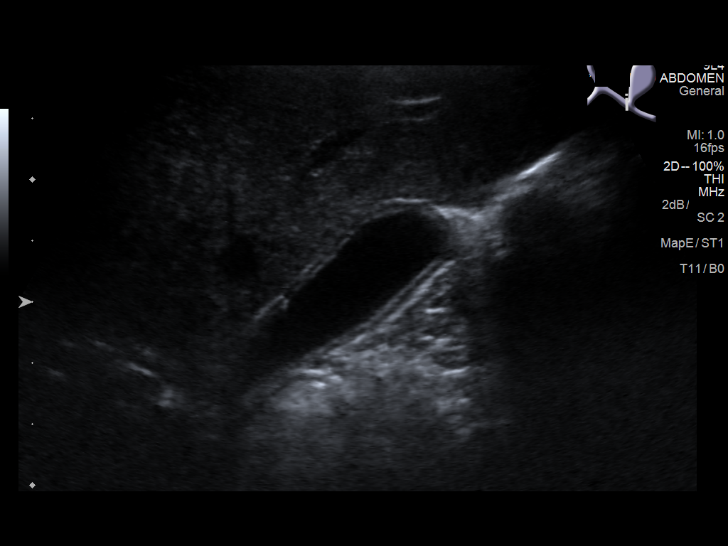
[im 17/37]
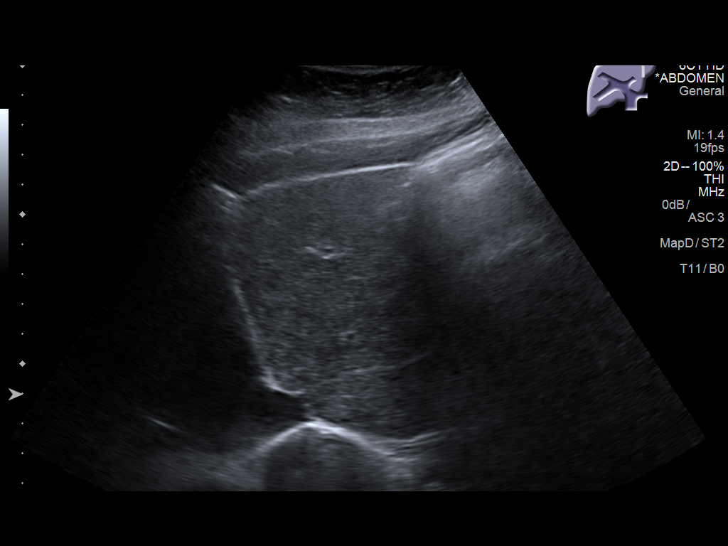
[im 20/37]
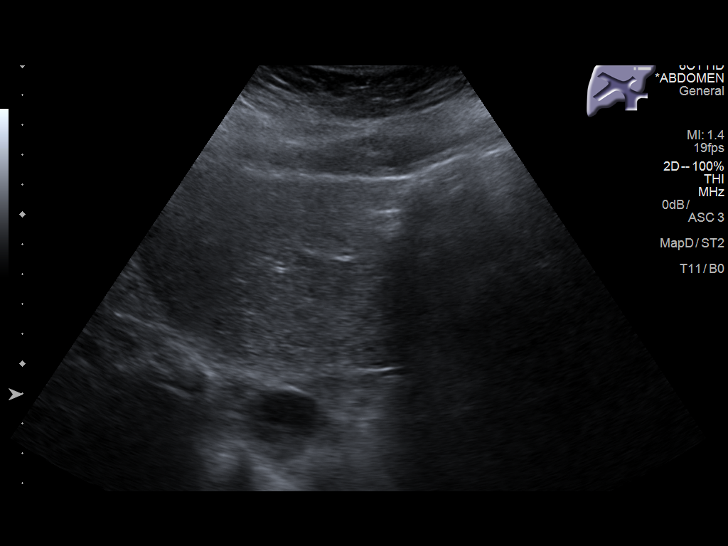
[im 23/37]
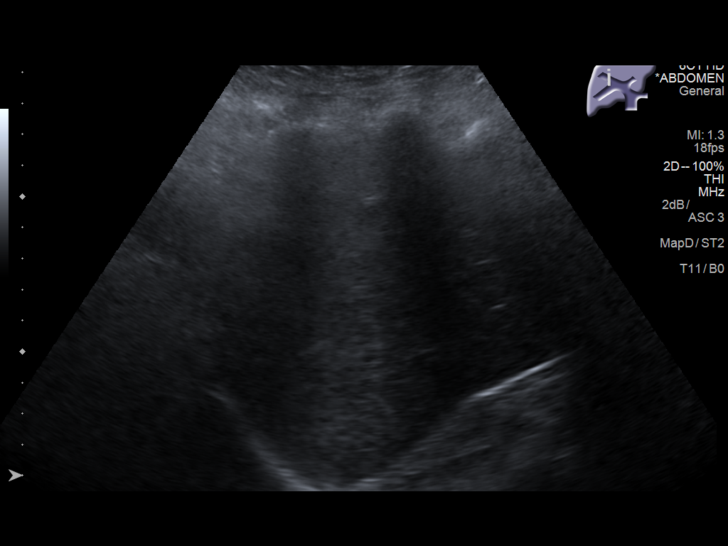
[im 25/37]
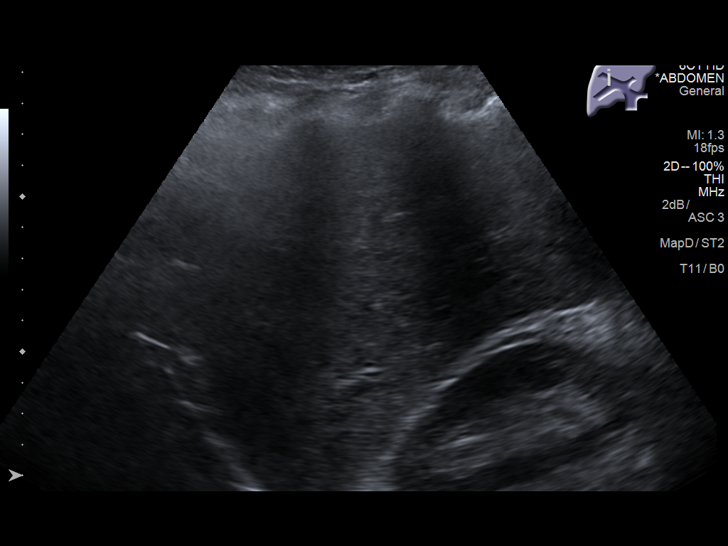
[im 28/37]
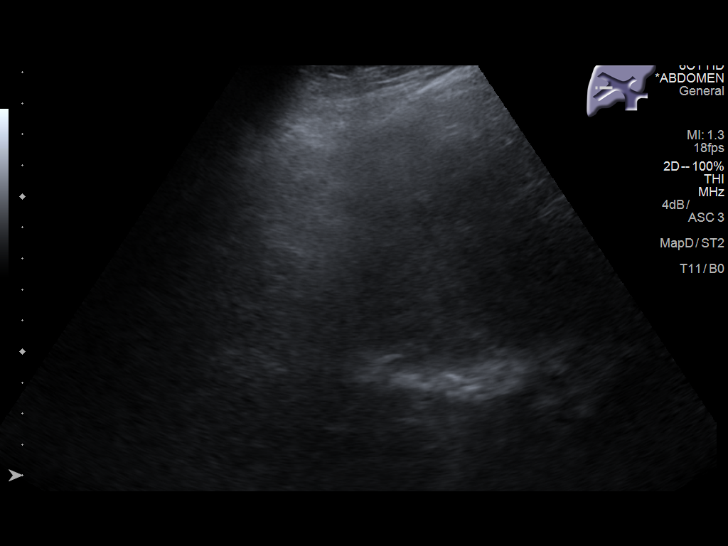
[im 31/37]
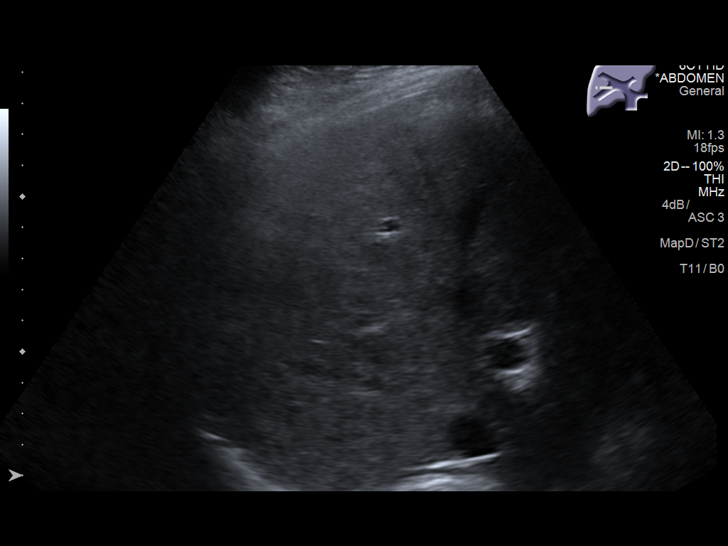
[im 34/37]
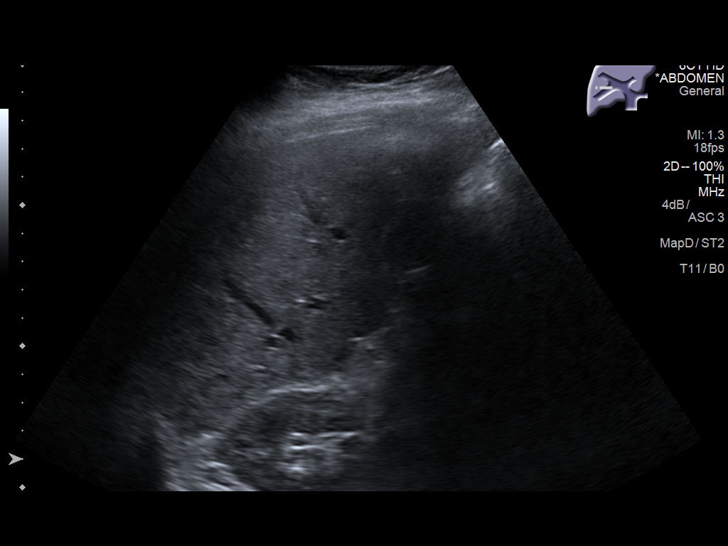
[im 37/37]
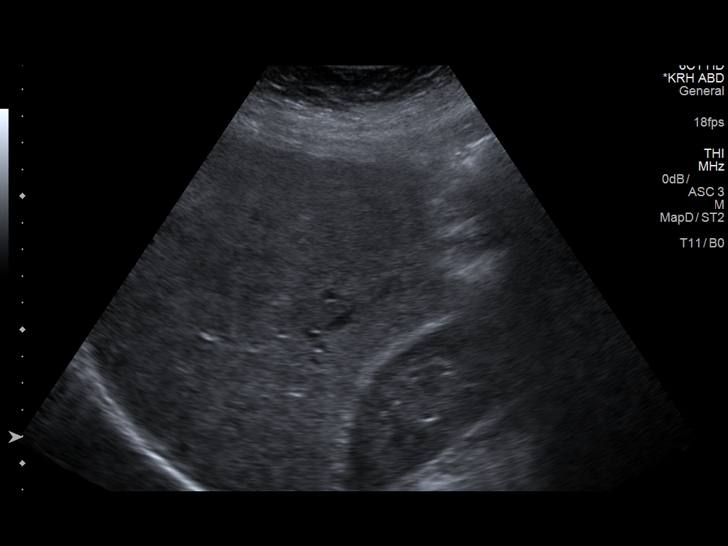

[14 of 25 positions shown; findings below may reference images not displayed]

FINDINGS: Gallbladder:

Decompressed with evidence of cholelithiasis and small gallbladder
polyps. No pericholecystic fluid is noted. Negative sonographic
Murphy sign is noted.

Common bile duct:

Diameter: 1.2 mm.

Liver:

No focal lesion identified. Within normal limits in parenchymal
echogenicity. Portal vein is patent on color Doppler imaging with
normal direction of blood flow towards the liver.
IMPRESSION: Decompressed gallbladder with evidence of cholelithiasis and
gallbladder polyps. No other focal abnormality is noted.

## 2018-11-06 ENCOUNTER — Emergency Department (HOSPITAL_BASED_OUTPATIENT_CLINIC_OR_DEPARTMENT_OTHER)
Admission: EM | Admit: 2018-11-06 | Discharge: 2018-11-06 | Disposition: A | Discharge status: Home / Self Care | Payer: Self-pay | Attending: Emergency Medicine | Admitting: Emergency Medicine

## 2018-11-06 ENCOUNTER — Encounter (HOSPITAL_BASED_OUTPATIENT_CLINIC_OR_DEPARTMENT_OTHER): Payer: Self-pay | Admitting: *Deleted

## 2018-11-06 ENCOUNTER — Other Ambulatory Visit: Payer: Self-pay

## 2018-11-06 DIAGNOSIS — R05 Cough: Secondary | ICD-10-CM | POA: Insufficient documentation

## 2018-11-06 DIAGNOSIS — Z5321 Procedure and treatment not carried out due to patient leaving prior to being seen by health care provider: Secondary | ICD-10-CM | POA: Insufficient documentation

## 2018-11-06 NOTE — ED Notes (Signed)
Called x4 for re-evaluation with no response.

## 2018-11-06 NOTE — ED Notes (Signed)
No answer when called for reevaluation 

## 2018-11-06 NOTE — ED Triage Notes (Addendum)
Cough and sore throat x 1 week. Soreness in her chest from coughing. States she has had a negative strep and was treated with PCN. Her son has a positive flu test but states she has not had a fever.

## 2018-12-25 IMAGING — DX DG CHEST 2V
2 series · 2 of 2 positions shown · non-contrast
Comparison: 02/11/2016

CLINICAL DATA: Chest pain and shortness of breath

EXAM:
CHEST - 2 VIEW

[chest pa]
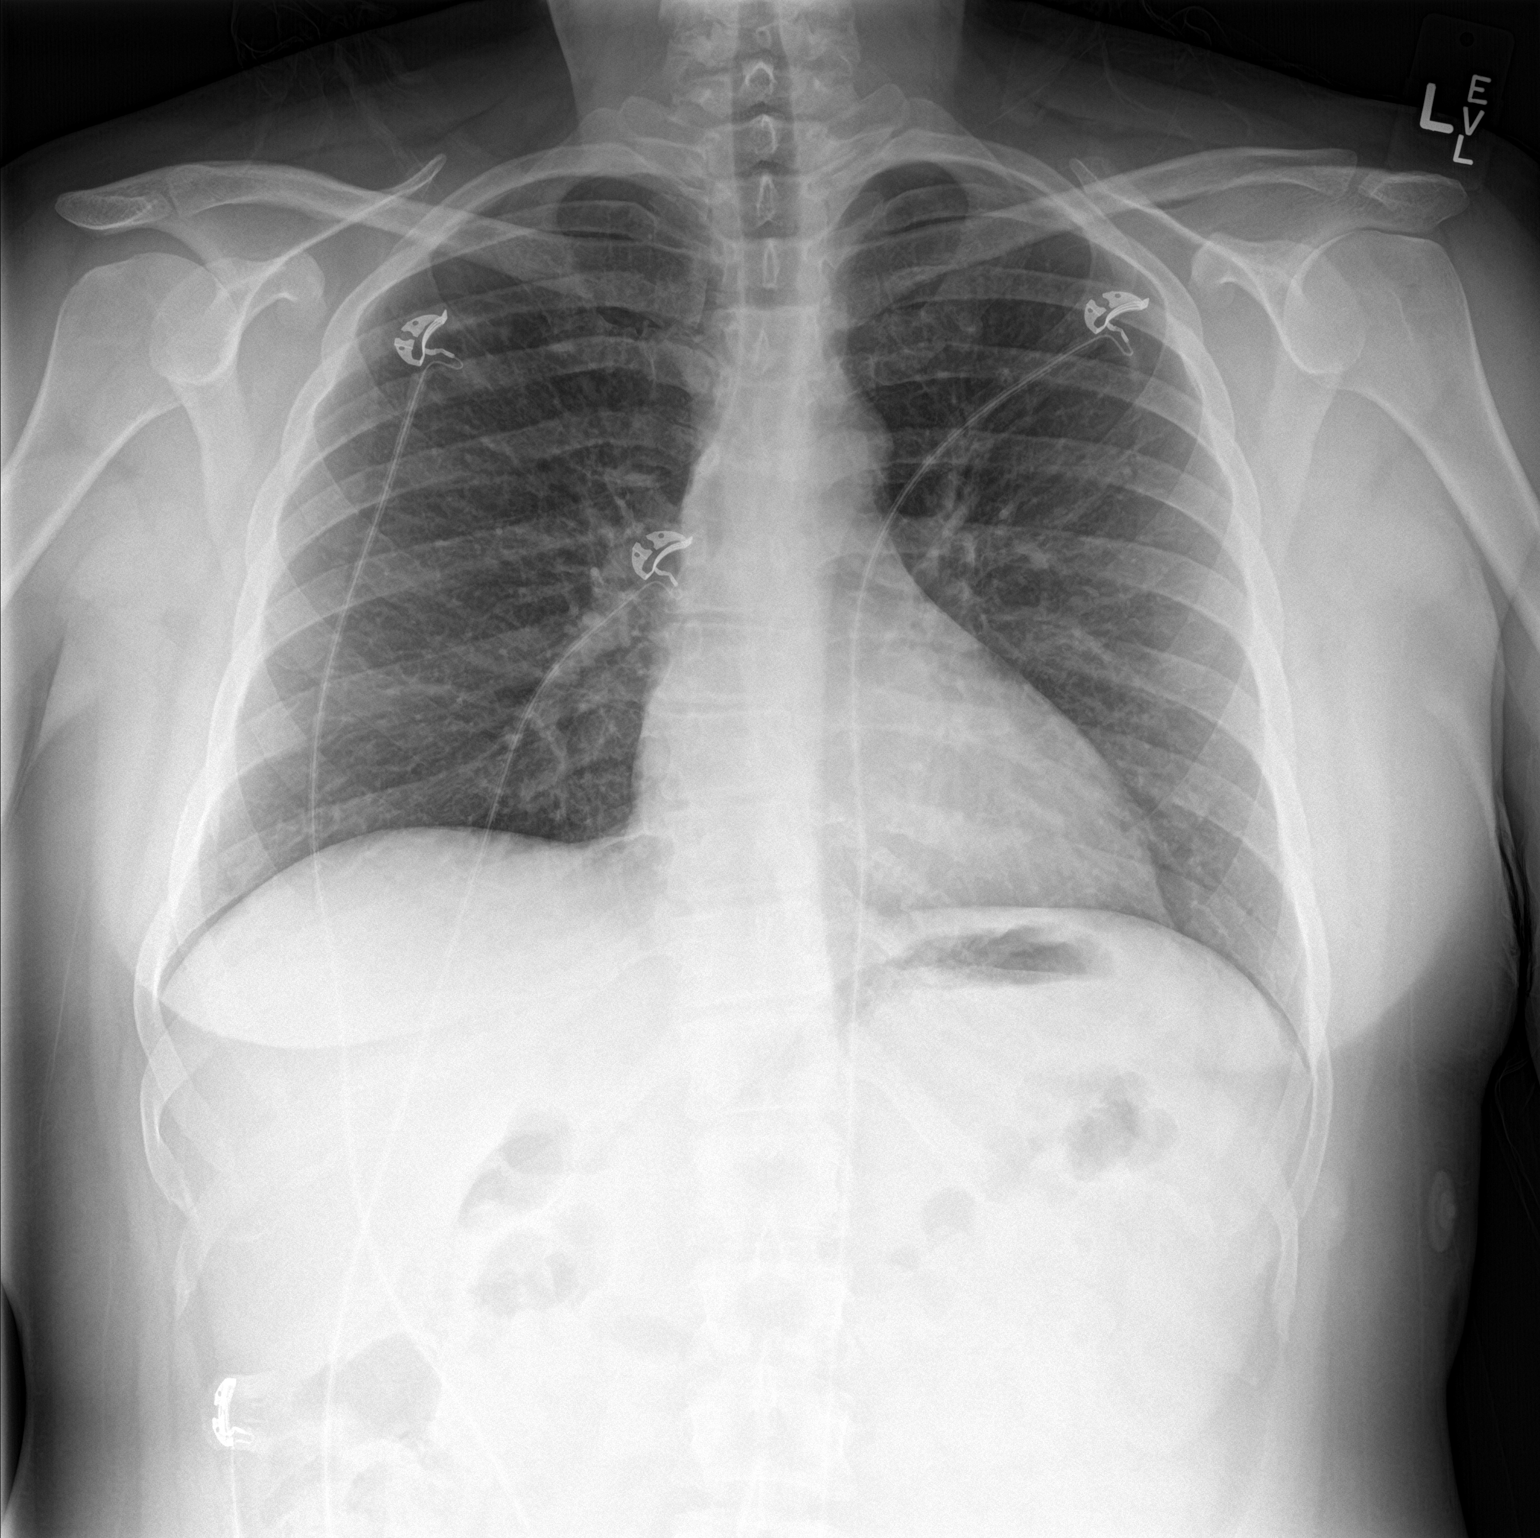

[chest lat]
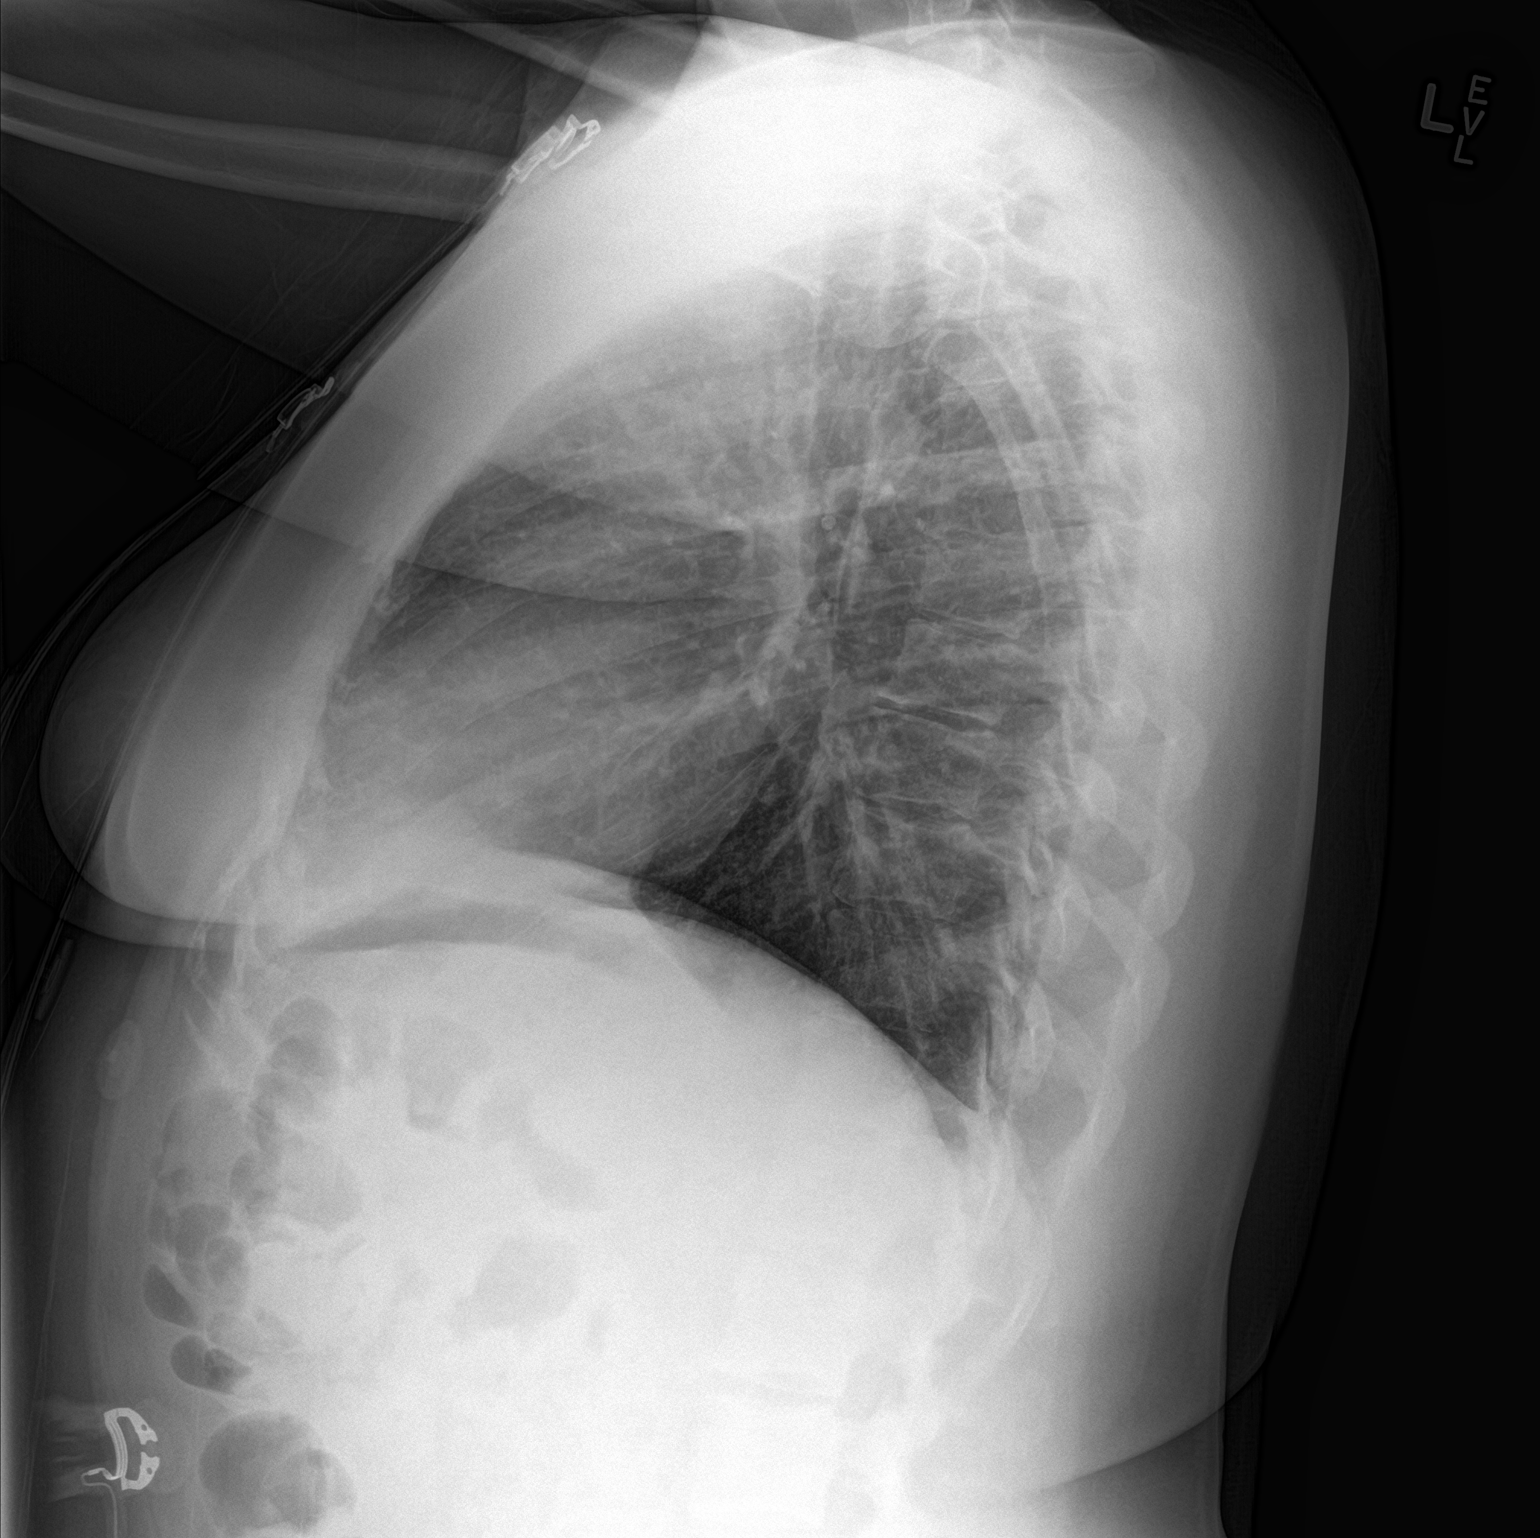

[2 of 2 positions shown; findings below may reference images not displayed]

FINDINGS: The heart size and mediastinal contours are within normal limits.
Both lungs are clear. The visualized skeletal structures are
unremarkable.
IMPRESSION: No active cardiopulmonary disease.

## 2019-05-22 IMAGING — CR DG CHEST 2V
2 series · 2 of 2 positions shown · non-contrast
Comparison: Chest radiograph March 14, 2018

CLINICAL DATA: LEFT chest pain radiating to arm and neck, shortness
of breath and cough for 5 days, progressively worsening.

EXAM:
CHEST - 2 VIEW

[w chest pa]
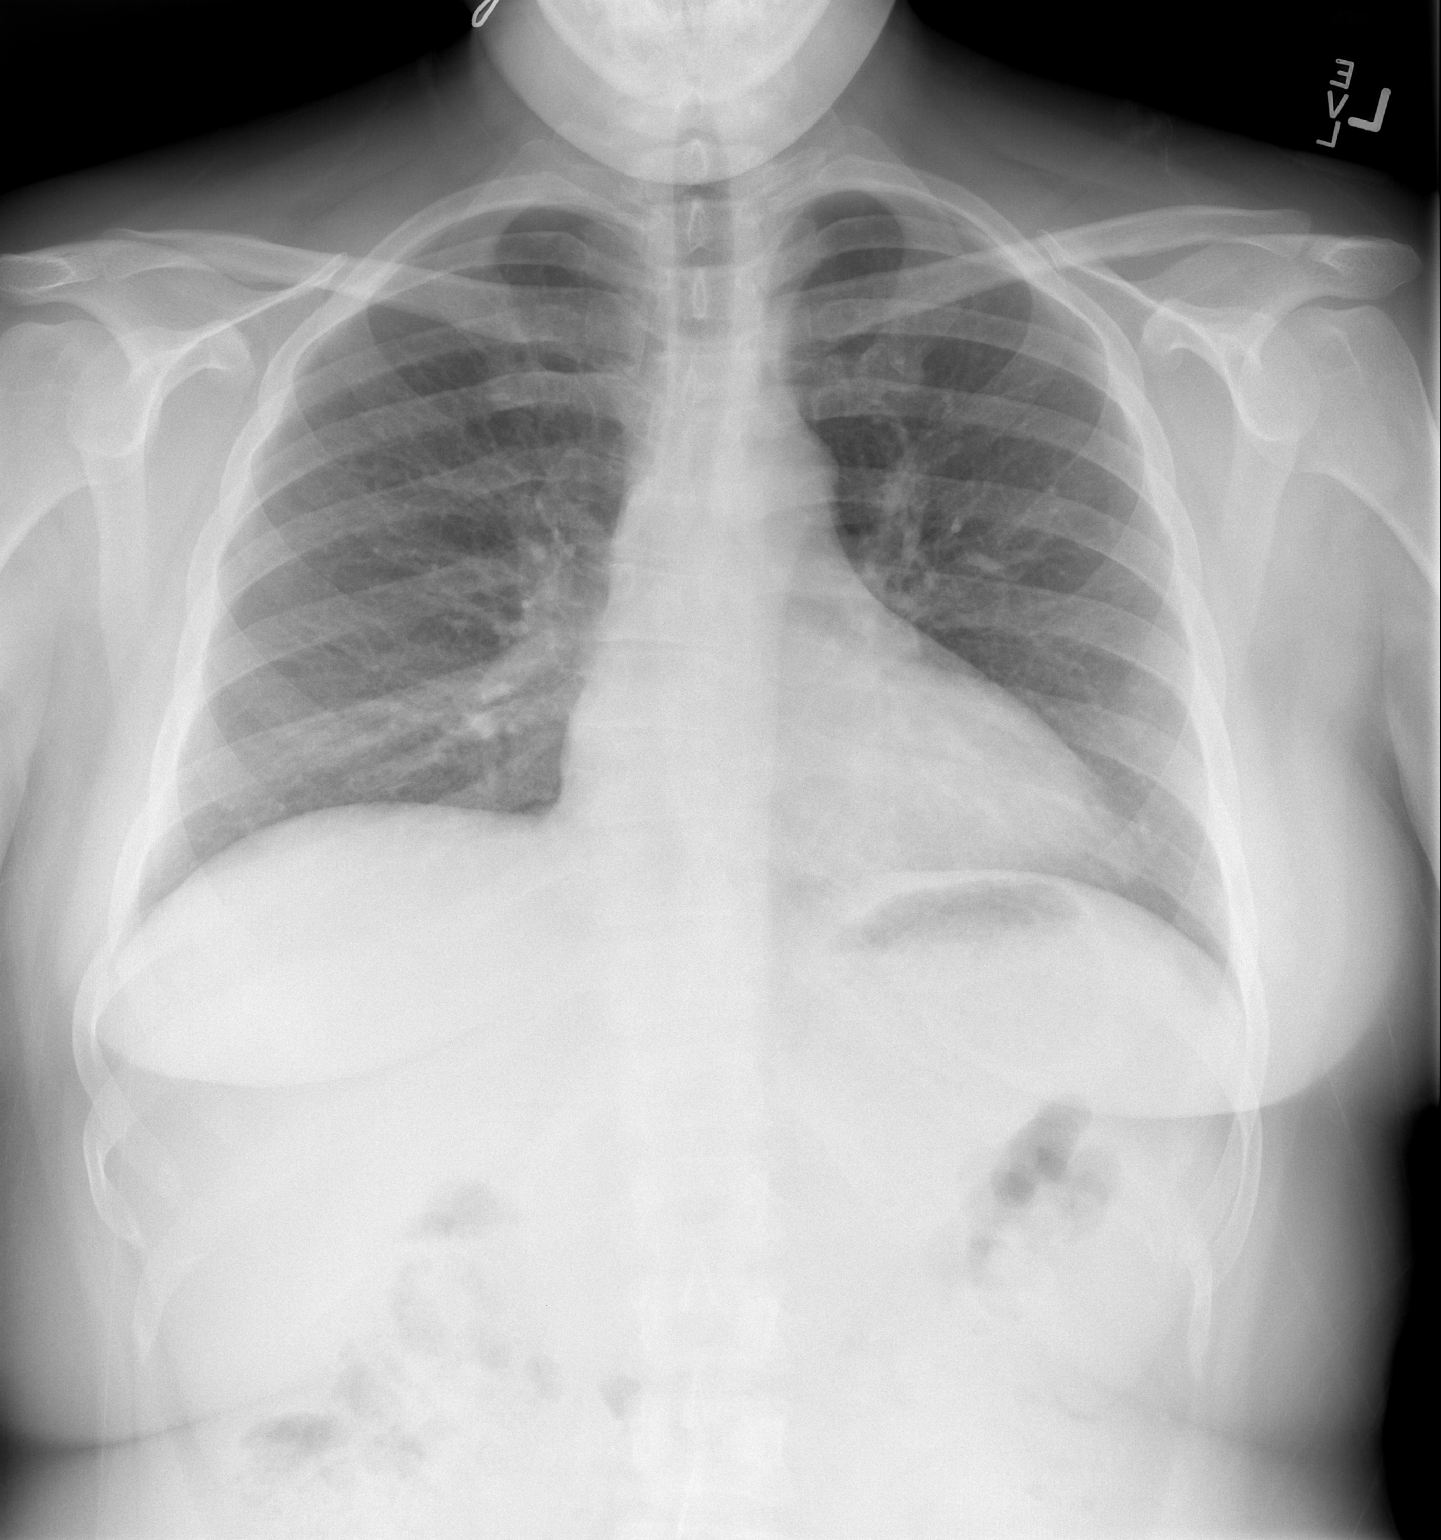

[w chest lat]
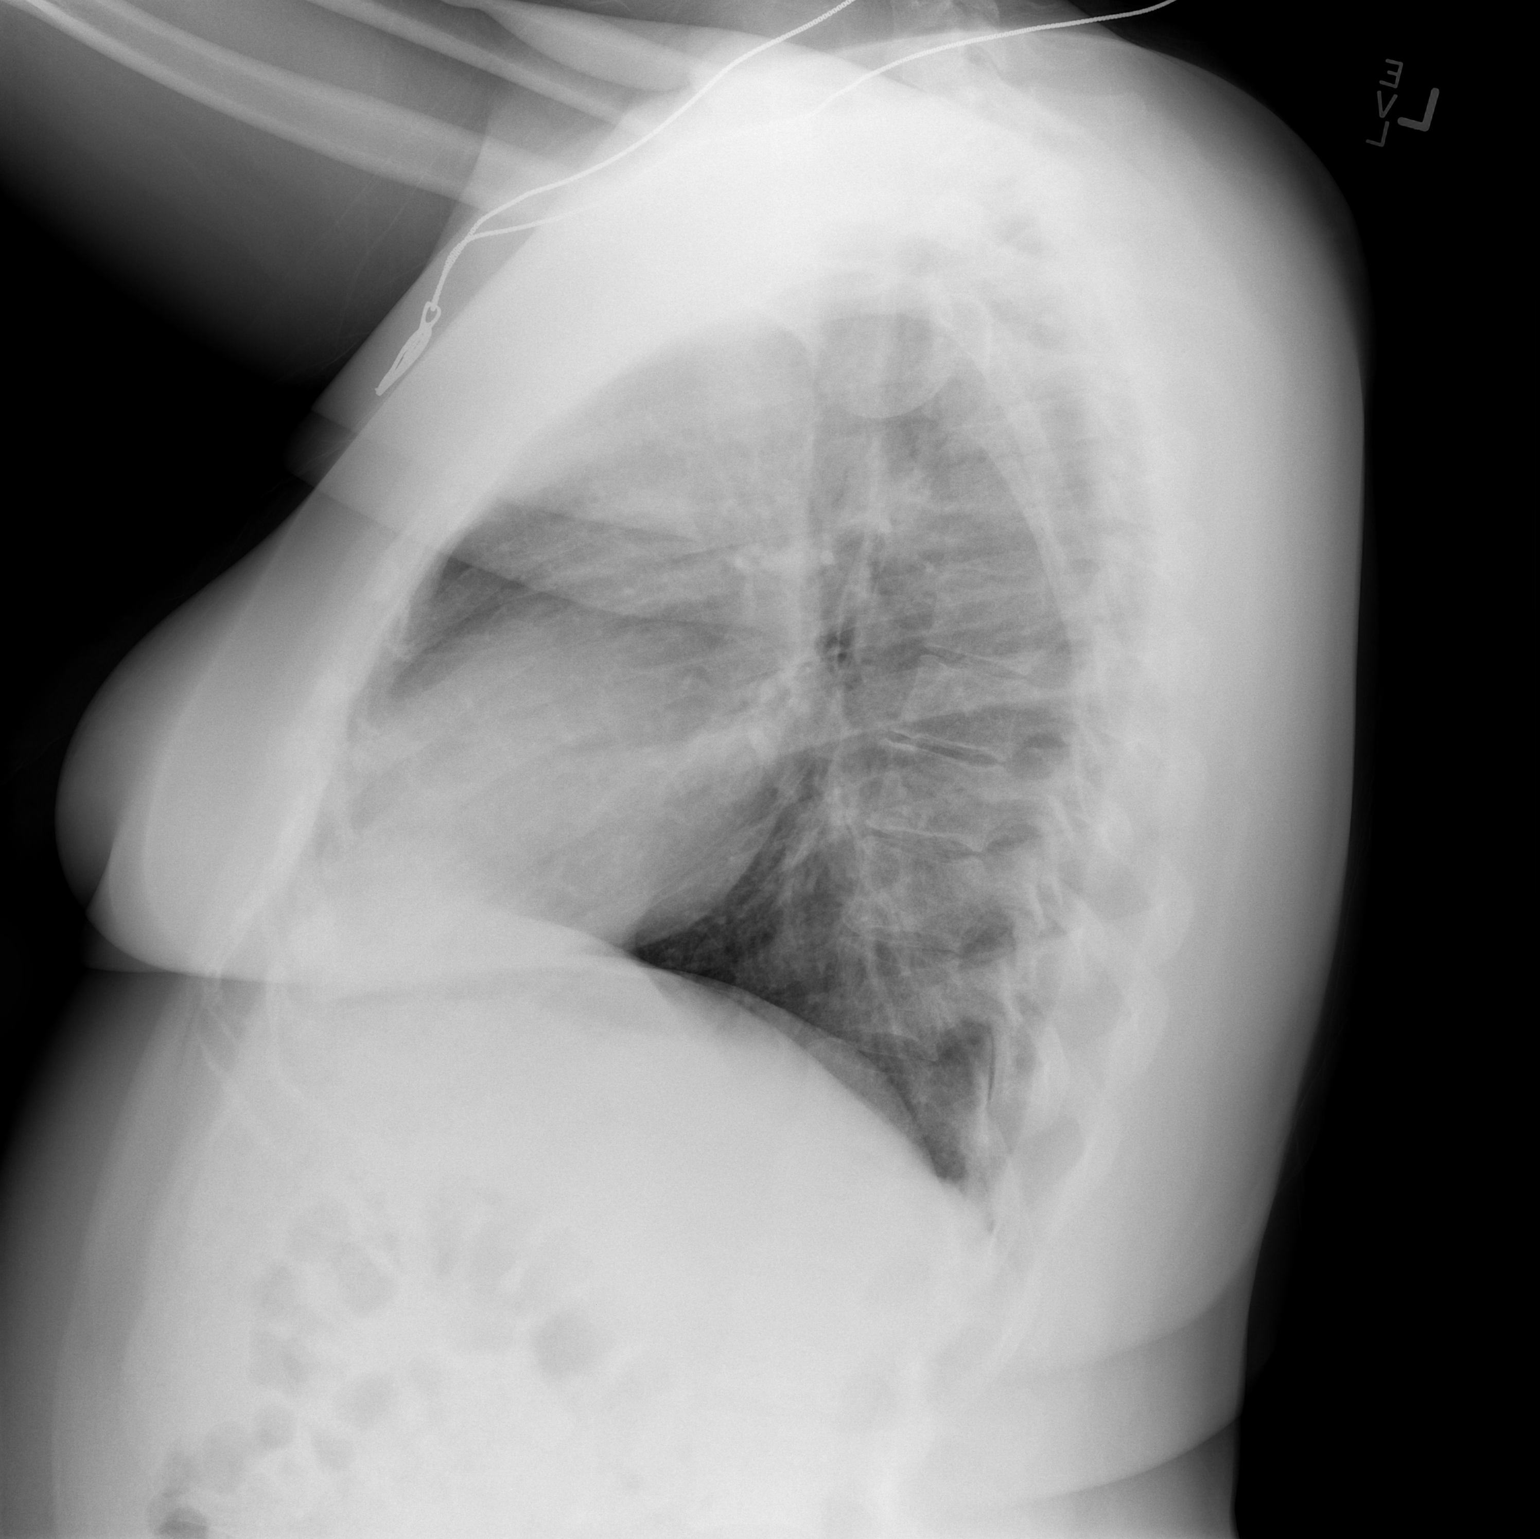

[2 of 2 positions shown; findings below may reference images not displayed]

FINDINGS: Cardiomediastinal silhouette is normal. No pleural effusions or
focal consolidations. Trachea projects midline and there is no
pneumothorax. Soft tissue planes and included osseous structures are
non-suspicious. Mild scoliosis.
IMPRESSION: No active cardiopulmonary process.
